# Patient Record
Sex: Male | Born: 1995 | Race: Black or African American | Hispanic: No | Marital: Single | Smoking: Former smoker
Health system: Southern US, Community
[De-identification: ages and names within clinical notes are randomized; demographics above are authoritative.]

## PROBLEM LIST (undated history)

## (undated) DIAGNOSIS — N4830 Priapism, unspecified: Secondary | ICD-10-CM

## (undated) DIAGNOSIS — I10 Essential (primary) hypertension: Secondary | ICD-10-CM

## (undated) DIAGNOSIS — J45909 Unspecified asthma, uncomplicated: Secondary | ICD-10-CM

---
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
## 2014-03-26 ENCOUNTER — Emergency Department: Payer: Self-pay | Admitting: Emergency Medicine

## 2014-03-26 LAB — COMPREHENSIVE METABOLIC PANEL
ALK PHOS: 71 U/L
ALT: 28 U/L
ANION GAP: 3 — AB (ref 7–16)
Albumin: 3.9 g/dL (ref 3.8–5.6)
BILIRUBIN TOTAL: 0.5 mg/dL (ref 0.2–1.0)
BUN: 10 mg/dL (ref 7–18)
CHLORIDE: 104 mmol/L (ref 98–107)
Calcium, Total: 8.6 mg/dL — ABNORMAL LOW (ref 9.0–10.7)
Co2: 32 mmol/L (ref 21–32)
Creatinine: 0.89 mg/dL (ref 0.60–1.30)
EGFR (African American): >60
Glucose: 102 mg/dL — ABNORMAL HIGH (ref 65–99)
Osmolality: 277 (ref 275–301)
POTASSIUM: 4.3 mmol/L (ref 3.5–5.1)
SGOT(AST): 10 U/L (ref 10–41)
SODIUM: 139 mmol/L (ref 136–145)
TOTAL PROTEIN: 7.3 g/dL (ref 6.4–8.6)

## 2014-03-26 LAB — CBC
HCT: 49.5 % (ref 40.0–52.0)
HGB: 16.3 g/dL (ref 13.0–18.0)
MCH: 30.2 pg (ref 26.0–34.0)
MCHC: 33 g/dL (ref 32.0–36.0)
MCV: 91 fL (ref 80–100)
Platelet: 274 10*3/uL (ref 150–440)
RBC: 5.41 10*6/uL (ref 4.40–5.90)
RDW: 13.2 % (ref 11.5–14.5)
WBC: 10.3 10*3/uL (ref 3.8–10.6)

## 2014-03-30 ENCOUNTER — Emergency Department: Payer: Self-pay | Admitting: Emergency Medicine

## 2014-04-02 ENCOUNTER — Emergency Department: Payer: Self-pay | Admitting: Emergency Medicine

## 2014-04-02 LAB — URINALYSIS, COMPLETE
BLOOD: NEGATIVE
Bacteria: NONE SEEN
Bilirubin,UR: NEGATIVE
GLUCOSE, UR: NEGATIVE mg/dL (ref 0–75)
KETONE: NEGATIVE
NITRITE: NEGATIVE
PH: 5 (ref 4.5–8.0)
Protein: NEGATIVE
RBC,UR: 2 /HPF (ref 0–5)
SPECIFIC GRAVITY: 1.018 (ref 1.003–1.030)
Squamous Epithelial: 4
WBC UR: 12 /HPF (ref 0–5)

## 2014-04-02 LAB — DIFFERENTIAL
COMMENT - H1-COM1: NORMAL
Comment - H1-Com2: NORMAL
EOS PCT: 3 %
Lymphocytes: 19 %
MONOS PCT: 3 %
Segmented Neutrophils: 73 %
VARIANT LYMPHOCYTE - H1-RLYMPH: 2 %

## 2014-04-02 LAB — COMPREHENSIVE METABOLIC PANEL
ALBUMIN: 4 g/dL (ref 3.8–5.6)
Alkaline Phosphatase: 73 U/L
Anion Gap: 5 — ABNORMAL LOW (ref 7–16)
BUN: 12 mg/dL (ref 7–18)
Bilirubin,Total: 0.8 mg/dL (ref 0.2–1.0)
CO2: 28 mmol/L (ref 21–32)
Calcium, Total: 8.4 mg/dL — ABNORMAL LOW (ref 9.0–10.7)
Chloride: 104 mmol/L (ref 98–107)
Creatinine: 0.89 mg/dL (ref 0.60–1.30)
EGFR (African American): >60
EGFR (Non-African Amer.): >60
Glucose: 98 mg/dL (ref 65–99)
Osmolality: 274 (ref 275–301)
Potassium: 4.1 mmol/L (ref 3.5–5.1)
SGOT(AST): 19 U/L (ref 10–41)
SGPT (ALT): 52 U/L
SODIUM: 137 mmol/L (ref 136–145)
Total Protein: 7.4 g/dL (ref 6.4–8.6)

## 2014-04-02 LAB — DRUG SCREEN, URINE
AMPHETAMINES, UR SCREEN: NEGATIVE (ref ?–1000)
BENZODIAZEPINE, UR SCRN: NEGATIVE (ref ?–200)
Barbiturates, Ur Screen: NEGATIVE (ref ?–200)
Cannabinoid 50 Ng, Ur ~~LOC~~: POSITIVE (ref ?–50)
Cocaine Metabolite,Ur ~~LOC~~: NEGATIVE (ref ?–300)
MDMA (ECSTASY) UR SCREEN: NEGATIVE (ref ?–500)
Methadone, Ur Screen: NEGATIVE (ref ?–300)
OPIATE, UR SCREEN: POSITIVE (ref ?–300)
PHENCYCLIDINE (PCP) UR S: NEGATIVE (ref ?–25)
Tricyclic, Ur Screen: NEGATIVE (ref ?–1000)

## 2014-04-02 LAB — CBC
HCT: 50.6 % (ref 40.0–52.0)
HGB: 17.1 g/dL (ref 13.0–18.0)
MCH: 30.6 pg (ref 26.0–34.0)
MCHC: 33.9 g/dL (ref 32.0–36.0)
MCV: 91 fL (ref 80–100)
Platelet: 274 10*3/uL (ref 150–440)
RBC: 5.59 10*6/uL (ref 4.40–5.90)
RDW: 13 % (ref 11.5–14.5)
WBC: 8.3 10*3/uL (ref 3.8–10.6)

## 2014-04-04 ENCOUNTER — Emergency Department: Payer: Self-pay | Admitting: Emergency Medicine

## 2014-04-24 ENCOUNTER — Emergency Department: Payer: Self-pay | Admitting: Emergency Medicine

## 2014-05-14 ENCOUNTER — Emergency Department: Payer: Self-pay | Admitting: Emergency Medicine

## 2014-05-24 ENCOUNTER — Emergency Department: Payer: Self-pay | Admitting: Emergency Medicine

## 2014-05-30 ENCOUNTER — Emergency Department: Payer: Self-pay | Admitting: Emergency Medicine

## 2014-09-22 NOTE — Consult Note (Signed)
PATIENT NAME:  Justin [name], Justin [name] MR#:  [id number] DATE OF BIRTH:  10-04-1995  DATE OF CONSULTATION:  04/02/2014  REFERRING PHYSICIAN:   CONSULTING PHYSICIAN:  Justin [name]. Justin ShellHart, DO  HISTORY OF PRESENT ILLNESS: This is a 19 year old with a second episode of priapism in the last 2 or 3 weeks. He has been in the ER at least once, twice or possibly 3 times. The only risk factors that we have diagnosed at this point are marijuana use. Denies cocaine use. He was given some Dilaudid and Sudafed and did not initially come down. The Sudafed with phenylephrine was p.o. Plan is to do a drug screen and blood smear for sickle cell disease. He will be discharged on Sudafed phenylephrine p.r.n. for priapism.   The patient historically has asthma.   Smokes quite a bit of marijuana. Denies cocaine use, drug use. He is otherwise healthy.   Historically, the patient has some burning on urination, penile pain and swelling. He has some abdominal pain that is suprapubic in nature.   PAST MEDICAL HISTORY: Priapism and asthma.   PAST SURGICAL HISTORY: None.  ALLERGIES: No known allergies.  REVIEW OF SYSTEMS: CONSTITUTIONAL: Recent illnesses - none. GASTROINTESTINAL: No nausea and vomiting,  MUSCULOSKELETAL: No joint pain. LYMPHATIC: No lymph node or ankle swelling.  CARDIOVASCULAR: No chest pain.  RESPIRATORY: He denies shortness of breath.  PHYSICAL EXAMINATION: GENERAL: Well-developed, well-nourished, overweight African American male in acute distress with an erect penis.  ENT: Pharynx normal.  NECK: Normal. RESPIRATORY: No shortness of breath. Breath sounds are normal.  HEART: Sounds with regular rate and rhythm. No tachycardia or bradycardia, but he does have increased blood pressure. EXTREMITIES: Nontender. No pedal edema.  NEUROPSYCHIATRIC: Oriented x4. Depressed mood and affect and anxious. VITAL SIGNS: Blood pressure 180/121.  SKIN: No rashes. Dry and intact.  GENITOURINARY: His testes appear to be  smaller than normal. His priapism is quite erect. He is circumcised. Glans is normal in size. The penis is normal in shape, consistency and size.   ASSESSMENT AND RECOMMENDATIONS: He spontaneously lost his erection so we are going to send him home on his medication. I am awaiting the smear.  Followup as p.r.n. for his problem. He will be sent home on his Sudafed phenylephrine for recurrent. At this point, we do not desire to use flutamide or Cialis 5 mg as a preventive measure for his erections. ____________________________ Justin [name]. Justin ShellHart, DO rdh:sb [name]: 04/02/2014 08:49:00 ET T: 04/02/2014 13:54:22 ET JOB#: [id number]  cc: Justin [name]. Justin ShellHart, DO, <Dictator> Justin [name] Justin Grenda DO ELECTRONICALLY SIGNED 04/16/2014 16:34

## 2014-09-26 NOTE — Consult Note (Signed)
PATIENT NAME:  Justin [name], Justin [name] MR#:  [id number] DATE OF BIRTH:  Feb 16, 1996  DATE OF CONSULTATION:  05/30/2014  REFERRING PHYSICIAN:  ED  CONSULTING PHYSICIAN:  Claris Gladden, MD  REASON FOR CONSULTATION: Priapism, requesting service via Emergency Room.   HISTORY OF PRESENT ILLNESS: This is a 19 year old male with recurrent episodes of priapism who presents today with painful erection since 6:00 a.m. to the Emergency Room. He has been seen for the same problem 8 times since October of 2015 in the Emergency Room and never has previously required injection therapy due to the priapism resolving spontaneously on its own.  He does admit to using marijuana the day of or the day previous to his priapistic event. He denies family or personal history of sickle cell anemia, testing as been requested in the past but no evidece that this was actually performed.    Today he had a priapism since 6 a.m. which has become progressively more painful throughout the day. He did try to take a Sudafed at home without resolution of his erection. He presented to the Emergency Room at 11:30 today at which time he was found to be profoundly hypertensive to 215/127. Urology was called at approximately 2:30 p.m. to evaluate the patient.   PAST MEDICAL HISTORY: Significant for asthma.   PAST SURGICAL HISTORY: Negative.  FAMILY HISTORY: No family history of sickle cell anemia or thalassemia.  SOCIAL HISTORY: The patient does endorse using frequent marijuana but denies any other illicit drugs, including cocaine, heroine or any other injectables or pills. He does drink alcohol socially. He also smokes cigarettes. He has a girlfriend who presents with him today.   ALLERGIES: None.   MEDICATIONS: ProAir p.r.n. shortness of breath q. 4 hours.  REVIEW OF SYSTEMS:  CONSTITUTIONAL: No weight loss, fevers or chills.  GASTROINTESTINAL: No nausea, vomiting.  LYMPHATIC: No enlarged lymph nodes or lower extremity edema.   CARDIOVASCULAR: No chest pain or palpitations.  RESPIRATORY: No shortness of breath or wheezing. MUSCULOSKELETAL: No myalgias or joint pain. GENITOURINARY: No voiding symptoms. No dysuria. No hematuria. No testicular pain.  ENDOCRINE: No night sweats or skin changes. No polyuria. NEUROLOGICAL: No CVA, headache or weakness.   HEENT: No dry eyes. No sore throat. No difficulty swallowing.  All other systems reviewed and are otherwise negative other than as per HPI.   PHYSICAL EXAMINATION:  VITAL SIGNS: Blood pressure at the time of examination 182/124, pulse 90, respirations 20.  GENERAL: No acute distress, alert and oriented, ambulating around the room at the time of arrival in no significant discomfort.  HEENT: Normal dentition. Normocephalic, atraumatic. Moist mucous membranes.  NEUROLOGICAL: Cranial nerves grossly intact. No focal deficits.  RESPIRATORY: No increased work of breathing or respiratory distress.  CARDIOVASCULAR: No lower extremity edema.  ABDOMEN: Soft, nontender, nondistended. No rebound or guarding. No CVA tenderness bilaterally. Bladder is nonpalpable.  GENITOURINARY: Erect circumcised phallus. Scrotum unremarkable, nontender. Testicles normal without masses.  RECTAL: Deferred today, is not pertinent. SKIN: No rashes or bruising.  MUSCULOSKELETAL: No abnormalities.  LYMPHATICS: No enlarged inguinal or axillary lymph nodes.   LABORATORY DATA: No laboratory data for review today.   ASSESSMENT AND PLAN: This is a 19 year old male with recurrent episodes of priapism, which generally has resolved spontaneously without need for intracavernosal phenylephrine or shunting. Per review of laboratory data, although the patient states he has no history of sickle cell anemia I do not see any laboratory data in our system to confirm that hemoglobin electrophoresis has been performed.  I also do not see a toxicology screen to rule out other substances other than marijuana, which the  patient does endorse. I suspect there is an underlying etiology for his recurrent episodes, either drug related or an underlying hemoglobinopathy. He has been previously scheduled to follow up in our clinic but has not shown up for these appointments. As I  was preparing phenylephrine injection, his erection resolved again spontaneously today greater than 50% without need for further intervention. I counseled him extensively on abstaining from any substances including both marijuana and any other illicit drugs. I urged him to follow up in clinic as scheduled for further workup, which he has agreed to. Thank you for allowing me to participate in the care of this patient again today.     ____________________________ Claris GladdenAshley J. Asbury Hair, MD ajb:TT D: 05/30/2014 19:25:52 ET T: 05/30/2014 20:04:25 ET JOB#: [id number]  cc: Claris GladdenAshley J. Tiffony Kite, MD, <Dictator> Claris GladdenASHLEY J Tige Meas MD ELECTRONICALLY SIGNED 06/13/2014 15:52

## 2014-12-08 ENCOUNTER — Emergency Department
Admission: EM | Admit: 2014-12-08 | Discharge: 2014-12-08 | Disposition: A | Discharge status: Home / Self Care | Payer: Medicaid Other | Attending: Emergency Medicine | Admitting: Emergency Medicine

## 2014-12-08 ENCOUNTER — Encounter: Payer: Self-pay | Admitting: Emergency Medicine

## 2014-12-08 DIAGNOSIS — Z760 Encounter for issue of repeat prescription: Secondary | ICD-10-CM | POA: Diagnosis present

## 2014-12-08 DIAGNOSIS — J4521 Mild intermittent asthma with (acute) exacerbation: Secondary | ICD-10-CM | POA: Diagnosis not present

## 2014-12-08 DIAGNOSIS — Z79899 Other long term (current) drug therapy: Secondary | ICD-10-CM | POA: Diagnosis not present

## 2014-12-08 HISTORY — DX: Unspecified asthma, uncomplicated: J45.909

## 2014-12-08 MED ORDER — IPRATROPIUM-ALBUTEROL 0.5-2.5 (3) MG/3ML IN SOLN
3.0000 mL | Freq: Once | RESPIRATORY_TRACT | Status: AC
  Administered 2014-12-08: 3 mL via RESPIRATORY_TRACT

## 2014-12-08 MED ORDER — ALBUTEROL SULFATE HFA 108 (90 BASE) MCG/ACT IN AERS: 2.0000 | INHALATION_SPRAY | Freq: Four times a day (QID) | RESPIRATORY_TRACT | Status: DC | PRN

## 2014-12-08 MED ORDER — IPRATROPIUM-ALBUTEROL 0.5-2.5 (3) MG/3ML IN SOLN
RESPIRATORY_TRACT | Status: AC
  Administered 2014-12-08: 3 mL via RESPIRATORY_TRACT
  Filled 2014-12-08: qty 3

## 2014-12-08 NOTE — Discharge Instructions (Signed)

## 2014-12-08 NOTE — ED Notes (Signed)
Pt. Declined IV insertion.

## 2014-12-08 NOTE — ED Notes (Signed)
Pt. States he lost rescue inhaler 2 days ago.

## 2014-12-09 NOTE — ED Provider Notes (Signed)
[facility] Emergency Department Provider Note ____________________________________________  Time seen: 2055  I have reviewed the triage vital signs and the nursing notes.  HISTORY  Chief Complaint  Asthma  HPI Justin [name] is a 19 y.o. male reports to the ED for a refill of his pro-air inhaler. He describes that he ran out of his rescue inhaler about 2 days ago. He reports onset today of wheezing upon arrival to the ED. He reports his symptoms have since resolved after receiving a DuoNeb treatment in triage. He denies any previous complaints of fever, chills, sweats, nausea, vomiting, or chest pain. Denies any pain at triage with a 0 out of 10 pain score.  Past Medical History  Diagnosis Date  . Asthma    There are no active problems to display for this patient.  History reviewed. No pertinent past surgical history.  Current Outpatient Rx  Name  Route  Sig  Dispense  Refill  . albuterol (PROVENTIL HFA;VENTOLIN HFA) 108 (90 BASE) MCG/ACT inhaler   Inhalation   Inhale 2 puffs into the lungs every 6 (six) hours as needed for wheezing or shortness of breath.         Marland Kitchen. albuterol (PROVENTIL HFA;VENTOLIN HFA) 108 (90 BASE) MCG/ACT inhaler   Inhalation   Inhale 2 puffs into the lungs every 6 (six) hours as needed for wheezing or shortness of breath.   1 Inhaler   0    Allergies Review of patient's allergies indicates not on file.  History reviewed. No pertinent family history.  Social History History  Substance Use Topics  . Smoking status: Current Every Day Smoker -- 0.50 packs/day  . Smokeless tobacco: Not on file  . Alcohol Use: No   Review of Systems  Constitutional: Negative for fever. Eyes: Negative for visual changes. ENT: Negative for sore throat. Cardiovascular: Negative for chest pain. Respiratory: Negative for shortness of breath. Reports wheezing today.  Gastrointestinal: Negative for abdominal pain, vomiting and  diarrhea. Genitourinary: Negative for dysuria. Musculoskeletal: Negative for back pain. Skin: Negative for rash. Neurological: Negative for headaches, focal weakness or numbness. ____________________________________________  PHYSICAL EXAM:  VITAL SIGNS: ED Triage Vitals  Enc Vitals Group     BP 12/08/14 1922 155/108 mmHg     Pulse Rate 12/08/14 1922 87     Resp 12/08/14 1922 25     Temp 12/08/14 1922 98.6 F (37 C)     Temp Source 12/08/14 1922 Oral     SpO2 12/08/14 1922 96 %     Weight 12/08/14 1922 250 lb (113.399 kg)     Height 12/08/14 1922 6\' 1"  (1.854 m)     Head Cir --      Peak Flow --      Pain Score 12/08/14 1924 0     Pain Loc --      Pain Edu? --      Excl. in GC? --    Constitutional: Alert and oriented. Well appearing and in no distress. Eyes: Conjunctivae are normal. PERRL. Normal extraocular movements. ENT   Head: Normocephalic and atraumatic.   Nose: No congestion/rhinnorhea.   Mouth/Throat: Mucous membranes are moist.   Neck: Supple. No thyromegaly. Hematological/Lymphatic/Immunilogical: No cervical lymphadenopathy. Cardiovascular: Normal rate, regular rhythm.  Respiratory: Normal respiratory effort. No wheezes/rales/rhonchi. Musculoskeletal: Nontender with normal range of motion in all extremities.  Neurologic:  Normal gait without ataxia. Normal speech and language. No gross focal neurologic deficits are appreciated. Skin:  Skin is warm, dry and intact. No  rash noted. Psychiatric: Mood and affect are normal. Patient exhibits appropriate insight and judgment. ____________________________________________  PROCEDURES  DuoNeb x 1 in triage. ____________________________________________  INITIAL IMPRESSION / ASSESSMENT AND PLAN / ED COURSE  ED visit for medication refill.  Treatment for acute wheezing with resolution of symptoms. Albuterol prescription. Patient referred to [facility] for maintenance medications.   ____________________________________________  FINAL CLINICAL IMPRESSION(S) / ED DIAGNOSES  Final diagnoses:  Asthma, mild intermittent, with acute exacerbation     Lissa Hoard, PA-C 12/09/14 0103  Loleta Rose, MD 12/09/14 1529

## 2014-12-28 ENCOUNTER — Emergency Department
Admission: EM | Admit: 2014-12-28 | Discharge: 2014-12-28 | Disposition: A | Discharge status: Home / Self Care | Payer: Medicaid Other | Attending: Emergency Medicine | Admitting: Emergency Medicine

## 2014-12-28 ENCOUNTER — Encounter: Payer: Self-pay | Admitting: Emergency Medicine

## 2014-12-28 DIAGNOSIS — J4531 Mild persistent asthma with (acute) exacerbation: Secondary | ICD-10-CM | POA: Insufficient documentation

## 2014-12-28 DIAGNOSIS — Z720 Tobacco use: Secondary | ICD-10-CM | POA: Insufficient documentation

## 2014-12-28 DIAGNOSIS — Z760 Encounter for issue of repeat prescription: Secondary | ICD-10-CM

## 2014-12-28 DIAGNOSIS — Z79899 Other long term (current) drug therapy: Secondary | ICD-10-CM | POA: Insufficient documentation

## 2014-12-28 DIAGNOSIS — Z7951 Long term (current) use of inhaled steroids: Secondary | ICD-10-CM | POA: Diagnosis not present

## 2014-12-28 DIAGNOSIS — J4530 Mild persistent asthma, uncomplicated: Secondary | ICD-10-CM

## 2014-12-28 MED ORDER — ALBUTEROL SULFATE HFA 108 (90 BASE) MCG/ACT IN AERS: 2.0000 | INHALATION_SPRAY | Freq: Four times a day (QID) | RESPIRATORY_TRACT | Status: DC | PRN

## 2014-12-28 MED ORDER — CETIRIZINE HCL 5 MG PO TABS: 5.0000 mg | ORAL_TABLET | Freq: Every day | ORAL | Status: DC

## 2014-12-28 MED ORDER — TRIAMCINOLONE ACETONIDE 55 MCG/ACT NA AERO: 1.0000 | INHALATION_SPRAY | Freq: Two times a day (BID) | NASAL | Status: DC

## 2014-12-28 NOTE — ED Provider Notes (Signed)
[facility] Emergency Department Provider Note? ____________________________________________ ? Time seen: 0815 ? I have reviewed the triage vital signs and the nursing notes. ________ HISTORY ? Chief Complaint Medication Refill  HPI  ARDIS Justin [name] is a 19 y.o. male reports to the ED with request for a pro-air MDI refill. He describes everyday use of his albuterol inhaler, for chest tightness and wheezing. He also reports that he is poorly compliant with the previously prescribed steroid nasal spray. He reports symptoms have been slightly aggravated by the increased heat and humidity, and notes some mild wheezing currently. He declines an offer for an nebulized albuterol treatment at this time.  Review of Systems  Constitutional: Negative for fever. Eyes: Negative for visual changes. ENT: Negative for sore throat. Cardiovascular: Negative for chest pain. Respiratory: Negative for shortness of breath. Reports some wheezing Gastrointestinal: Negative for abdominal pain, vomiting and diarrhea. Musculoskeletal: Negative for back pain. Skin: Negative for rash. Neurological: Negative for headaches, focal weakness or numbness.  10-point ROS otherwise negative. ____________________________________________  Past Medical History  Diagnosis Date  . Asthma    There are no active problems to display for this patient. ? No past surgical history on file. ? Current Outpatient Rx  Name  Route  Sig  Dispense  Refill  . albuterol (PROVENTIL HFA;VENTOLIN HFA) 108 (90 BASE) MCG/ACT inhaler   Inhalation   Inhale 2 puffs into the lungs every 6 (six) hours as needed for wheezing or shortness of breath.         Marland Kitchen albuterol (PROVENTIL HFA;VENTOLIN HFA) 108 (90 BASE) MCG/ACT inhaler   Inhalation   Inhale 2 puffs into the lungs every 6 (six) hours as needed for wheezing or shortness of breath.   1 Inhaler   0   . albuterol (PROVENTIL HFA;VENTOLIN HFA) 108 (90 BASE)  MCG/ACT inhaler   Inhalation   Inhale 2 puffs into the lungs every 6 (six) hours as needed for wheezing or shortness of breath.   1 Inhaler   0   . cetirizine (ZYRTEC) 5 MG tablet   Oral   Take 1 tablet (5 mg total) by mouth daily.   30 tablet   0   . triamcinolone (NASACORT AQ) 55 MCG/ACT AERO nasal inhaler   Nasal   Place 1 spray into the nose 2 (two) times daily.   1 Inhaler   1   ? Allergies Review of patient's allergies indicates no known allergies. ? No family history on file. ? Social History History  Substance Use Topics  . Smoking status: Current Every Day Smoker -- 0.50 packs/day  . Smokeless tobacco: Not on file  . Alcohol Use: No    PHYSICAL EXAM:  VITAL SIGNS: ED Triage Vitals  Enc Vitals Group     BP 12/28/14 0806 134/88 mmHg     Pulse Rate 12/28/14 0806 85     Resp --      Temp 12/28/14 0806 98.2 F (36.8 C)     Temp Source 12/28/14 0806 Oral     SpO2 12/28/14 0806 97 %     Weight 12/28/14 0806 250 lb (113.399 kg)     Height 12/28/14 0806  (1.854 m)     Head Cir --      Peak Flow --      Pain Score 12/28/14 0801 0     Pain Loc --      Pain Edu? --      Excl. in GC? --  Constitutional: Alert and oriented. Well appearing and in no distress. Eyes: Conjunctivae are normal. PERRL. Normal extraocular movements. ENT   Head: Normocephalic and atraumatic.   Nose: No congestion/rhinnorhea.   Mouth/Throat: Mucous membranes are moist.      Ears: Normal external exam. Canals clear. TMs clear bilaterally.   Neck: Supple. No lymphadenopathy. Cardiovascular: Normal rate, regular rhythm. Normal and symmetric distal pulses are present in all extremities. No murmurs, rubs, or gallops. Respiratory: Normal respiratory effort without tachypnea nor retractions. Mild expiratory wheezes bilaterally. No rales/rhonchi. Musculoskeletal: Normal range of motion in all extremities.  Neurologic:  Normal speech and language. No gross focal neurologic  deficits are appreciated.  Skin:  Skin is warm, dry and intact. No rash noted. Psychiatric: Mood and affect are normal. Patient exhibits appropriate insight and judgment. ______________________________________________________ INITIAL IMPRESSION / ASSESSMENT AND PLAN / ED COURSE ? Discussed the importance of seeking out a primary provider to better manage asthma. I added that daily rescue inhaler use indicates poor asthma control.  Patient needs to restart daily nasal spray and allergy medicine.  Will once again provide referrals to the local community clinics as stress that he not seek refills in the ED. ____________________________________________ FINAL CLINICAL IMPRESSION(S) / ED DIAGNOSES?  Final diagnoses:  Medication refill  Asthma, mild persistent, uncomplicated      Lissa Hoard, PA-C 12/28/14 9604  Darien Ramus, MD 12/28/14 1538

## 2014-12-28 NOTE — Discharge Instructions (Signed)
Asthma °Asthma is a condition of the lungs in which the airways tighten and narrow. Asthma can make it hard to breathe. Asthma cannot be cured, but medicine and lifestyle changes can help control it. Asthma may be started (triggered) by: °· Animal skin flakes (dander). °· Dust. °· Cockroaches. °· Pollen. °· Mold. °· Smoke. °· Cleaning products. °· Hair sprays or aerosol sprays. °· Paint fumes or strong smells. °· Cold air, weather changes, and winds. °· Crying or laughing hard. °· Stress. °· Certain medicines or drugs. °· Foods, such as dried fruit, potato chips, and sparkling grape juice. °· Infections or conditions (colds, flu). °· Exercise. °· Certain medical conditions or diseases. °· Exercise or tiring activities. °HOME CARE  °· Take medicine as told by your doctor. °· Use a peak flow meter as told by your doctor. A peak flow meter is a tool that measures how well the lungs are working. °· Record and keep track of the peak flow meter's readings. °· Understand and use the asthma action plan. An asthma action plan is a written plan for taking care of your asthma and treating your attacks. °· To help prevent asthma attacks: °¨ Do not smoke. Stay away from secondhand smoke. °¨ Change your heating and air conditioning filter often. °¨ Limit your use of fireplaces and wood stoves. °¨ Get rid of pests (such as roaches and mice) and their droppings. °¨ Throw away plants if you see mold on them. °¨ Clean your floors. Dust regularly. Use cleaning products that do not smell. °¨ Have someone vacuum when you are not home. Use a vacuum cleaner with a HEPA filter if possible. °¨ Replace carpet with wood, tile, or vinyl flooring. Carpet can trap animal skin flakes and dust. °¨ Use allergy-proof pillows, mattress covers, and box spring covers. °¨ Wash bed sheets and blankets every week in hot water and dry them in a dryer. °¨ Use blankets that are made of polyester or cotton. °¨ Clean bathrooms and kitchens with bleach. If  possible, have someone repaint the walls in these rooms with mold-resistant paint. Keep out of the rooms that are being cleaned and painted. °¨ Wash hands often. °GET HELP IF: °· You have make a whistling sound when breaking (wheeze), have shortness of breath, or have a cough even if taking medicine to prevent attacks. °· The colored mucus you cough up (sputum) is thicker than usual. °· The colored mucus you cough up changes from clear or white to yellow, green, gray, or bloody. °· You have problems from the medicine you are taking such as: °¨ A rash. °¨ Itching. °¨ Swelling. °¨ Trouble breathing. °· You need reliever medicines more than 2-3 times a week. °· Your peak flow measurement is still at 50-79% of your personal best after following the action plan for 1 hour. °· You have a fever. °GET HELP RIGHT AWAY IF:  °· You seem to be worse and are not responding to medicine during an asthma attack. °· You are short of breath even at rest. °· You get short of breath when doing very little activity. °· You have trouble eating, drinking, or talking. °· You have chest pain. °· You have a fast heartbeat. °· Your lips or fingernails start to turn blue. °· You are light-headed, dizzy, or faint. °· Your peak flow is less than 50% of your personal best. °MAKE SURE YOU:  °· Understand these instructions. °· Will watch your condition. °· Will get help right away if you   are not doing well or get worse. Document Released: 11/04/2007 Document Revised: 10/02/2013 Document Reviewed: 12/15/2012 [facility] Patient Information 2015 [place], [place]. This information is not intended to replace advice given to you by your health care provider. Make sure you discuss any questions you have with your health care provider.  Medication Refill, Emergency Department We have refilled your medication today as a courtesy to you. It is best for your medical care, however, to take care of getting refills done through your primary caregiver's  office. They have your records and can do a better job of follow-up than we can in the emergency department. On maintenance medications, we often only prescribe enough medications to get you by until you are able to see your regular caregiver. This is a more expensive way to refill medications. In the future, please plan for refills so that you will not have to use the emergency department for this. Thank you for your help. Your help allows [place] to better take care of the daily emergencies that enter our department. Document Released: 09/04/2003 Document Revised: 08/10/2011 Document Reviewed: 08/25/2013 [facility] Patient Information 2015 [place], [place]. This information is not intended to replace advice given to you by your health care provider. Make sure you discuss any questions you have with your health care provider.  You should select a see a medical provider at one of the community clinics listed.  You should not return to the ED for inhaler refills in the future. Your healthcare needs regular visits with a provider in a clinic setting.

## 2014-12-28 NOTE — ED Notes (Signed)
Has asthma and is out of proair.  Uses prn and has been wheezing

## 2015-01-26 ENCOUNTER — Encounter: Payer: Self-pay | Admitting: Medical Oncology

## 2015-01-26 ENCOUNTER — Emergency Department
Admission: EM | Admit: 2015-01-26 | Discharge: 2015-01-26 | Disposition: A | Discharge status: Home / Self Care | Payer: Medicaid Other | Attending: Emergency Medicine | Admitting: Emergency Medicine

## 2015-01-26 DIAGNOSIS — Z720 Tobacco use: Secondary | ICD-10-CM | POA: Diagnosis not present

## 2015-01-26 DIAGNOSIS — J4521 Mild intermittent asthma with (acute) exacerbation: Secondary | ICD-10-CM

## 2015-01-26 DIAGNOSIS — Z79899 Other long term (current) drug therapy: Secondary | ICD-10-CM | POA: Insufficient documentation

## 2015-01-26 MED ORDER — IPRATROPIUM-ALBUTEROL 0.5-2.5 (3) MG/3ML IN SOLN
3.0000 mL | Freq: Once | RESPIRATORY_TRACT | Status: AC
  Administered 2015-01-26: 3 mL via RESPIRATORY_TRACT

## 2015-01-26 MED ORDER — ALBUTEROL SULFATE HFA 108 (90 BASE) MCG/ACT IN AERS: 1.0000 | INHALATION_SPRAY | Freq: Four times a day (QID) | RESPIRATORY_TRACT | Status: DC | PRN

## 2015-01-26 MED ORDER — IPRATROPIUM-ALBUTEROL 0.5-2.5 (3) MG/3ML IN SOLN
RESPIRATORY_TRACT | Status: AC
  Administered 2015-01-26: 3 mL via RESPIRATORY_TRACT
  Filled 2015-01-26: qty 3

## 2015-01-26 NOTE — ED Provider Notes (Signed)
[facility] Emergency Department Provider Note  ____________________________________________  Time seen: Approximately 11:20 AM  I have reviewed the triage vital signs and the nursing notes.   HISTORY  Chief Complaint Asthma    HPI Justin [name] is a 19 y.o. male patient here today secondary to exacerbation of his asthma. Patient was seen 2 weeks ago and given Ventolin inhaler. Patient state he requests pro-air inhaler  because Ventolin inhaler does not work for him. Patient admits to not being compliant with Ventolin inhaler  because he does not feel like it is working. Since the last visit patient still had not established a primary care doctor for his asthma control.Patient has moderate audible wheezing but denies shortness of breath.   Past Medical History  Diagnosis Date  . Asthma     There are no active problems to display for this patient.   History reviewed. No pertinent past surgical history.  Current Outpatient Rx  Name  Route  Sig  Dispense  Refill  . albuterol (PROAIR HFA) 108 (90 BASE) MCG/ACT inhaler   Inhalation   Inhale 1 puff into the lungs every 6 (six) hours as needed for wheezing or shortness of breath.   18 g   0   . albuterol (PROVENTIL HFA;VENTOLIN HFA) 108 (90 BASE) MCG/ACT inhaler   Inhalation   Inhale 2 puffs into the lungs every 6 (six) hours as needed for wheezing or shortness of breath.         Marland Kitchen albuterol (PROVENTIL HFA;VENTOLIN HFA) 108 (90 BASE) MCG/ACT inhaler   Inhalation   Inhale 2 puffs into the lungs every 6 (six) hours as needed for wheezing or shortness of breath.   1 Inhaler   0   . albuterol (PROVENTIL HFA;VENTOLIN HFA) 108 (90 BASE) MCG/ACT inhaler   Inhalation   Inhale 2 puffs into the lungs every 6 (six) hours as needed for wheezing or shortness of breath.   1 Inhaler   0   . cetirizine (ZYRTEC) 5 MG tablet   Oral   Take 1 tablet (5 mg total) by mouth daily.   30 tablet   0   .  triamcinolone (NASACORT AQ) 55 MCG/ACT AERO nasal inhaler   Nasal   Place 1 spray into the nose 2 (two) times daily.   1 Inhaler   1     Allergies Review of patient's allergies indicates no known allergies.  No family history on file.  Social History Social History  Substance Use Topics  . Smoking status: Current Every Day Smoker -- 0.50 packs/day    Types: Cigarettes  . Smokeless tobacco: None  . Alcohol Use: No    Review of Systems Constitutional: No fever/chills Eyes: No visual changes. ENT: No sore throat. Cardiovascular: Denies chest pain. Respiratory: Denies shortness of breath. Wheezing Gastrointestinal: No abdominal pain.  No nausea, no vomiting.  No diarrhea.  No constipation. Genitourinary: Negative for dysuria. Musculoskeletal: Negative for back pain. Skin: Negative for rash. Neurological: Negative for headaches, focal weakness or numbness. 10-point ROS otherwise negative.  ____________________________________________   PHYSICAL EXAM:  VITAL SIGNS: ED Triage Vitals  Enc Vitals Group     BP 01/26/15 1051 131/99 mmHg     Pulse Rate 01/26/15 1051 64     Resp --      Temp 01/26/15 1051 97.7 F (36.5 C)     Temp Source 01/26/15 1051 Oral     SpO2 01/26/15 1051 98 %     Weight 01/26/15 1051  250 lb (113.399 kg)     Height 01/26/15 1051  (1.854 m)     Head Cir --      Peak Flow --      Pain Score 01/26/15 1057 0     Pain Loc --      Pain Edu? --      Excl. in GC? --     Constitutional: Alert and oriented. Well appearing and in no acute distress. Eyes: Conjunctivae are normal. PERRL. EOMI. Head: Atraumatic. Nose: No congestion/rhinnorhea. Mouth/Throat: Mucous membranes are moist.  Oropharynx non-erythematous. Neck: No stridor.  No cervical spine tenderness to palpation. Hematological/Lymphatic/Immunilogical: No cervical lymphadenopathy. Cardiovascular: Normal rate, regular rhythm. Grossly normal heart sounds.  Good peripheral  circulation. Respiratory: Normal respiratory effort.  No retractions. Lungs expiratory wheezing  Gastrointestinal: Soft and nontender. No distention. No abdominal bruits. No CVA tenderness. Musculoskeletal: No lower extremity tenderness nor edema.  No joint effusions. Neurologic:  Normal speech and language. No gross focal neurologic deficits are appreciated. No gait instability. Skin:  Skin is warm, dry and intact. No rash noted. Psychiatric: Mood and affect are normal. Speech and behavior are normal.  ____________________________________________   LABS (all labs ordered are listed, but only abnormal results are displayed)  Labs Reviewed - No data to display ____________________________________________  EKG   ____________________________________________  RADIOLOGY   ____________________________________________   PROCEDURES  Procedure(s) performed: None  Critical Care performed: No  ____________________________________________   INITIAL IMPRESSION / ASSESSMENT AND PLAN / ED COURSE  Pertinent labs & imaging results that were available during my care of the patient were reviewed by me and considered in my medical decision making (see chart for details).  Mild persistent uncomplicated asthma. Patient improved status post  nebulized treatmens. Patient given a prescription for pro-air. Patient advised to find a primary care doctor tomorrow to control his asthma. [facility] ____________________________________________   FINAL CLINICAL IMPRESSION(S) / ED DIAGNOSES  Final diagnoses:  Asthma exacerbation attacks, mild intermittent      Joni Reining, PA-C 01/26/15 1130  Minna Antis, MD 01/26/15 [phone]

## 2015-01-26 NOTE — ED Notes (Signed)
Pt reports that he was seen here 2 weeks ago and treated for his asthma, states that his inhaler that he was prescribed is not working. Pt reports he woke up this am with sob. Denies pain.

## 2015-01-26 NOTE — Discharge Instructions (Signed)

## 2015-03-08 ENCOUNTER — Emergency Department
Admission: EM | Admit: 2015-03-08 | Discharge: 2015-03-08 | Disposition: A | Discharge status: Home / Self Care | Payer: Medicaid Other | Attending: Emergency Medicine | Admitting: Emergency Medicine

## 2015-03-08 ENCOUNTER — Encounter: Payer: Self-pay | Admitting: *Deleted

## 2015-03-08 DIAGNOSIS — J45901 Unspecified asthma with (acute) exacerbation: Secondary | ICD-10-CM

## 2015-03-08 DIAGNOSIS — J45909 Unspecified asthma, uncomplicated: Secondary | ICD-10-CM | POA: Diagnosis present

## 2015-03-08 DIAGNOSIS — Z720 Tobacco use: Secondary | ICD-10-CM | POA: Insufficient documentation

## 2015-03-08 DIAGNOSIS — Z79899 Other long term (current) drug therapy: Secondary | ICD-10-CM | POA: Insufficient documentation

## 2015-03-08 MED ORDER — PREDNISONE 20 MG PO TABS
60.0000 mg | ORAL_TABLET | Freq: Once | ORAL | Status: AC
  Administered 2015-03-08: 60 mg via ORAL
  Filled 2015-03-08: qty 3

## 2015-03-08 MED ORDER — IPRATROPIUM-ALBUTEROL 0.5-2.5 (3) MG/3ML IN SOLN
3.0000 mL | Freq: Once | RESPIRATORY_TRACT | Status: AC
  Administered 2015-03-08: 3 mL via RESPIRATORY_TRACT
  Filled 2015-03-08: qty 3

## 2015-03-08 MED ORDER — PREDNISONE 10 MG PO TABS: 50.0000 mg | ORAL_TABLET | Freq: Every day | ORAL | Status: DC

## 2015-03-08 MED ORDER — ALBUTEROL SULFATE HFA 108 (90 BASE) MCG/ACT IN AERS: 2.0000 | INHALATION_SPRAY | Freq: Four times a day (QID) | RESPIRATORY_TRACT | Status: DC | PRN

## 2015-03-08 NOTE — Discharge Instructions (Signed)

## 2015-03-08 NOTE — ED Notes (Signed)
Pt reports increase in shortness of breath over past few days. Does not have inhaler at home. Pt speaking in full sentences, but can hear expiratory wheezing.

## 2015-03-08 NOTE — ED Notes (Signed)
Reports relief with neb treatment.  Minimal wheezing in upper lobes

## 2015-03-08 NOTE — ED Provider Notes (Signed)
[facility] Emergency Department Provider Note  ____________________________________________  Time seen: Approximately 4:36 PM  I have reviewed the triage vital signs and the nursing notes.   HISTORY  Chief Complaint Asthma   HPI Justin [name] is a 19 y.o. male who presents to the emergency department for treatment of  asthma exacerbation. Heis out off his albuterol inhaler. He states that when it started raining today, he began to have more difficulty breathing than usual. He denies fever or productive cough.   Past Medical History  Diagnosis Date  . Asthma     There are no active problems to display for this patient.   History reviewed. No pertinent past surgical history.  Current Outpatient Rx  Name  Route  Sig  Dispense  Refill  . albuterol (PROVENTIL HFA;VENTOLIN HFA) 108 (90 BASE) MCG/ACT inhaler   Inhalation   Inhale 2 puffs into the lungs every 6 (six) hours as needed for wheezing or shortness of breath.   1 Inhaler   2   . cetirizine (ZYRTEC) 5 MG tablet   Oral   Take 1 tablet (5 mg total) by mouth daily.   30 tablet   0   . predniSONE (DELTASONE) 10 MG tablet   Oral   Take 5 tablets (50 mg total) by mouth daily.   25 tablet   0   . triamcinolone (NASACORT AQ) 55 MCG/ACT AERO nasal inhaler   Nasal   Place 1 spray into the nose 2 (two) times daily.   1 Inhaler   1     Allergies Review of patient's allergies indicates no known allergies.  No family history on file.  Social History Social History  Substance Use Topics  . Smoking status: Current Every Day Smoker -- 0.50 packs/day    Types: Cigarettes  . Smokeless tobacco: None  . Alcohol Use: No    Review of Systems Constitutional: No fever/chills Eyes: No visual changes. ENT: No sore throat. Cardiovascular: Denies chest pain. Respiratory: Occasional shortness of breath. Gastrointestinal: No abdominal pain.  No nausea, no vomiting.  No diarrhea.  No  constipation. Genitourinary: Negative for dysuria. Musculoskeletal: Negative for back pain. Skin: Negative for rash. Neurological: Negative for headaches, focal weakness or numbness.  10-point ROS otherwise negative.  ____________________________________________   PHYSICAL EXAM:  VITAL SIGNS: ED Triage Vitals  Enc Vitals Group     BP 03/08/15 1618 111/90 mmHg     Pulse Rate 03/08/15 1618 61     Resp 03/08/15 1618 20     Temp 03/08/15 1618 97.8 F (36.6 C)     Temp Source 03/08/15 1618 Oral     SpO2 03/08/15 1618 98 %     Weight 03/08/15 1618 250 lb (113.399 kg)     Height 03/08/15 1618  (1.854 m)     Head Cir --      Peak Flow --      Pain Score 03/08/15 1630 0     Pain Loc --      Pain Edu? --      Excl. in GC? --     Constitutional: Alert and oriented. Well appearing and in no acute distress. Eyes: Conjunctivae are normal. PERRL. EOMI. Head: Atraumatic. Nose: No congestion/rhinnorhea. Mouth/Throat: Mucous membranes are moist.  Oropharynx non-erythematous. Neck: No stridor.   Cardiovascular: Normal rate, regular rhythm. Grossly normal heart sounds.  Good peripheral circulation. Respiratory: Normal respiratory effort.  No retractions. Expiratory wheezing noted bilateral bases. Gastrointestinal: Soft and nontender. No  distention. No abdominal bruits. No CVA tenderness. Musculoskeletal: No lower extremity tenderness nor edema.  No joint effusions. Neurologic:  Normal speech and language. No gross focal neurologic deficits are appreciated. No gait instability. Skin:  Skin is warm, dry and intact. No rash noted. Psychiatric: Mood and affect are normal. Speech and behavior are normal.  ____________________________________________   LABS (all labs ordered are listed, but only abnormal results are displayed)  Labs Reviewed - No data to  display ____________________________________________  EKG   ____________________________________________  RADIOLOGY   ____________________________________________   PROCEDURES  Procedure(s) performed: None  Critical Care performed: No  ____________________________________________   INITIAL IMPRESSION / ASSESSMENT AND PLAN / ED COURSE  Pertinent labs & imaging results that were available during my care of the patient were reviewed by me and considered in my medical decision making (see chart for details).  Patient had some relief with Duo Neb and prednisone in the ER. He requested discharge. He was given Rx for Ventolin and prednisone and advised to follow up with the PCP of his choice or return to the ER for symptoms that change or worsen. ____________________________________________   FINAL CLINICAL IMPRESSION(S) / ED DIAGNOSES  Final diagnoses:  Asthma exacerbation      Chinita Pester, FNP 03/08/15 2207  Minna Antis, MD 03/08/15 2318

## 2015-03-27 ENCOUNTER — Emergency Department
Admission: EM | Admit: 2015-03-27 | Discharge: 2015-03-27 | Disposition: A | Discharge status: Home / Self Care | Payer: Medicaid Other | Attending: Emergency Medicine | Admitting: Emergency Medicine

## 2015-03-27 ENCOUNTER — Encounter: Payer: Self-pay | Admitting: *Deleted

## 2015-03-27 DIAGNOSIS — J4541 Moderate persistent asthma with (acute) exacerbation: Secondary | ICD-10-CM | POA: Insufficient documentation

## 2015-03-27 DIAGNOSIS — Z79899 Other long term (current) drug therapy: Secondary | ICD-10-CM | POA: Diagnosis not present

## 2015-03-27 DIAGNOSIS — Z720 Tobacco use: Secondary | ICD-10-CM | POA: Diagnosis not present

## 2015-03-27 DIAGNOSIS — J45909 Unspecified asthma, uncomplicated: Secondary | ICD-10-CM | POA: Diagnosis present

## 2015-03-27 MED ORDER — ALBUTEROL SULFATE HFA 108 (90 BASE) MCG/ACT IN AERS: INHALATION_SPRAY | RESPIRATORY_TRACT | Status: DC

## 2015-03-27 MED ORDER — PREDNISONE 20 MG PO TABS
60.0000 mg | ORAL_TABLET | ORAL | Status: AC
  Administered 2015-03-27: 60 mg via ORAL
  Filled 2015-03-27: qty 3

## 2015-03-27 MED ORDER — IPRATROPIUM-ALBUTEROL 0.5-2.5 (3) MG/3ML IN SOLN
3.0000 mL | Freq: Once | RESPIRATORY_TRACT | Status: AC
  Administered 2015-03-27: 3 mL via RESPIRATORY_TRACT
  Filled 2015-03-27 (×2): qty 3

## 2015-03-27 MED ORDER — IPRATROPIUM-ALBUTEROL 0.5-2.5 (3) MG/3ML IN SOLN
RESPIRATORY_TRACT | Status: AC
  Administered 2015-03-27: 3 mL via RESPIRATORY_TRACT
  Filled 2015-03-27: qty 3

## 2015-03-27 MED ORDER — IPRATROPIUM-ALBUTEROL 0.5-2.5 (3) MG/3ML IN SOLN
RESPIRATORY_TRACT | Status: AC
  Filled 2015-03-27: qty 3

## 2015-03-27 MED ORDER — IPRATROPIUM-ALBUTEROL 0.5-2.5 (3) MG/3ML IN SOLN
3.0000 mL | Freq: Once | RESPIRATORY_TRACT | Status: AC
  Administered 2015-03-27: 3 mL via RESPIRATORY_TRACT

## 2015-03-27 MED ORDER — PREDNISONE 20 MG PO TABS: 60.0000 mg | ORAL_TABLET | Freq: Every day | ORAL | Status: DC

## 2015-03-27 NOTE — ED Provider Notes (Signed)
[facility] Emergency Department Provider Note  ____________________________________________  Time seen: Approximately 5:02 PM  I have reviewed the triage vital signs and the nursing notes.   HISTORY  Chief Complaint Asthma    HPI Justin [name] is a 19 y.o. male with a history of asthma since childhood and who is a tobacco user presents with worsening shortness of breath for the last hour.  He had an albuterol inhaler over the last several weeks since his last emergency department visit but it has run out.  He has not yet been able to get back to [place] where his [place]CP [place] located.  He states that his wheezing and shortness of breath is severe and worsened by physical activity and with weather changes.  Rest makes it a little bit better but does not completely resolve it.  He denies fever/chills, chest pain, abdominal pain, nausea, vomiting, dysuria.   Past Medical History  Diagnosis Date  . Asthma     There are no active problems to display for this patient.   History reviewed. No pertinent past surgical history.  Current Outpatient Rx  Name  Route  Sig  Dispense  Refill  . albuterol (PROVENTIL HFA;VENTOLIN HFA) 108 (90 BASE) MCG/ACT inhaler      Inhale 4-6 puffs by mouth every 4 hours as needed for wheezing, cough, and/or shortness of breath   1 Inhaler   2   . cetirizine (ZYRTEC) 5 MG tablet   Oral   Take 1 tablet (5 mg total) by mouth daily.   30 tablet   0   . predniSONE (DELTASONE) 20 MG tablet   Oral   Take 3 tablets (60 mg total) by mouth daily.   15 tablet   0   . triamcinolone (NASACORT AQ) 55 MCG/ACT AERO nasal inhaler   Nasal   Place 1 spray into the nose 2 (two) times daily.   1 Inhaler   1     Allergies Review of patient's allergies indicates no known allergies.  No family history on file.  Social History Social History  Substance Use Topics  . Smoking status: Current Every Day Smoker -- 0.50 packs/day   Types: Cigarettes  . Smokeless tobacco: None  . Alcohol Use: No    Review of Systems Constitutional: No fever/chills Eyes: No visual changes. ENT: No sore throat. Cardiovascular: Denies chest pain. Respiratory: Shortness of breath with wheezing that feels similar to prior asthma exacerbations Gastrointestinal: No abdominal pain.  No nausea, no vomiting.  No diarrhea.  No constipation. Genitourinary: Negative for dysuria. Musculoskeletal: Negative for back pain. Skin: Negative for rash. Neurological: Negative for headaches, focal weakness or numbness.  10-point ROS otherwise negative.  ____________________________________________   PHYSICAL EXAM:  VITAL SIGNS: ED Triage Vitals  Enc Vitals Group     BP 03/27/15 1638 176/105 mmHg     Pulse Rate 03/27/15 1638 82     Resp 03/27/15 1638 18     Temp 03/27/15 1638 97.7 F (36.5 C)     Temp Source 03/27/15 1638 Oral     SpO2 03/27/15 1638 96 %     Weight 03/27/15 1638 260 lb (117.935 kg)     Height 03/27/15 1638  (1.854 m)     Head Cir --      Peak Flow --      Pain Score --      Pain Loc --      Pain Edu? --  Excl. in GC? --     Constitutional: Alert and oriented. Well appearing and in no acute distress. Eyes: Conjunctivae are normal. PERRL. EOMI. Head: Atraumatic. Nose: No congestion/rhinnorhea. Mouth/Throat: Mucous membranes are moist.  Oropharynx non-erythematous. Neck: No stridor.   Cardiovascular: Normal rate, regular rhythm. Grossly normal heart sounds.  Good peripheral circulation. Respiratory: Slightly increased respiratory effort.  Moderate to severe respiratory wheezing throughout all lung fields.  Wheezing is audible even without stethoscope auscultation Gastrointestinal: Soft and nontender. No distention. No abdominal bruits. No CVA tenderness. Musculoskeletal: No lower extremity tenderness nor edema.  No joint effusions. Neurologic:  Normal speech and language. No gross focal neurologic deficits are  appreciated.  Skin:  Skin is warm, dry and intact. No rash noted. Psychiatric: Mood and affect are normal. Speech and behavior are normal.  ____________________________________________   LABS (all labs ordered are listed, but only abnormal results are displayed)  Labs Reviewed - No data to display ____________________________________________  EKG  Not indicated ____________________________________________  RADIOLOGY   No results found.  ____________________________________________   PROCEDURES  Procedure(s) performed: None  Critical Care performed: No ____________________________________________   INITIAL IMPRESSION / ASSESSMENT AND PLAN / ED COURSE  Pertinent labs & imaging results that were available during my care of the patient were reviewed by me and considered in my medical decision making (see chart for details).  The patient's signs and symptoms are consistent with an asthma exacerbation.  I stressed to him the importance of not using tobacco and of following up as an outpatient so that he can get on a maintenance regimen of medications.  Here in the emergency department I will give him a dose of prednisone and prescribe him a course, and I will give him 3 DuoNeb labs and prescribe him another albuterol inhaler.  On 2 separate occasions I stressed the importance of close outpatient follow-up and I gave him my usual customary return precautions.  ____________________________________________  FINAL CLINICAL IMPRESSION(S) / ED DIAGNOSES  Final diagnoses:  Acute asthma exacerbation, moderate persistent      NEW MEDICATIONS STARTED DURING THIS VISIT:  New Prescriptions   ALBUTEROL (PROVENTIL HFA;VENTOLIN HFA) 108 (90 BASE) MCG/ACT INHALER    Inhale 4-6 puffs by mouth every 4 hours as needed for wheezing, cough, and/or shortness of breath   PREDNISONE (DELTASONE) 20 MG TABLET    Take 3 tablets (60 mg total) by mouth daily.     Loleta Roseory Aarian Griffie, MD 03/27/15  [phone]

## 2015-03-27 NOTE — Discharge Instructions (Signed)
We believe that your symptoms are caused today by an exacerbation of your asthma.  Please take the prescribed medications and any medications that you have at home.  Follow up with your doctor as recommended.  If you develop any new or worsening symptoms, including but not limited to fever, persistent vomiting, worsening shortness of breath, or other symptoms that concern you, please return to the Emergency Department immediately. ° ° °Asthma °Asthma is a recurring condition in which the airways tighten and narrow. Asthma can make it difficult to breathe. It can cause coughing, wheezing, and shortness of breath. Asthma episodes, also called asthma attacks, range from minor to life-threatening. Asthma cannot be cured, but medicines and lifestyle changes can help control it. °CAUSES °Asthma is believed to be caused by inherited (genetic) and environmental factors, but its exact cause is unknown. Asthma may be triggered by allergens, lung infections, or irritants in the air. Asthma triggers are different for each person. Common triggers include:  °1. Animal dander. °2. Dust mites. °3. Cockroaches. °4. Pollen from trees or grass. °5. Mold. °6. Smoke. °7. Air pollutants such as dust, household cleaners, hair sprays, aerosol sprays, paint fumes, strong chemicals, or strong odors. °8. Cold air, weather changes, and winds (which increase molds and pollens in the air). °9. Strong emotional expressions such as crying or laughing hard. °10. Stress. °11. Certain medicines (such as aspirin) or types of drugs (such as beta-blockers). °12. Sulfites in foods and drinks. Foods and drinks that may contain sulfites include dried fruit, potato chips, and sparkling grape juice. °13. Infections or inflammatory conditions such as the flu, a cold, or an inflammation of the nasal membranes (rhinitis). °14. Gastroesophageal reflux disease (GERD). °15. Exercise or strenuous activity. °SYMPTOMS °Symptoms may occur immediately after asthma is  triggered or many hours later. Symptoms include: °1. Wheezing. °2. Excessive nighttime or early morning coughing. °3. Frequent or severe coughing with a common cold. °4. Chest tightness. °5. Shortness of breath. °DIAGNOSIS  °The diagnosis of asthma is made by a review of your medical history and a physical exam. Tests may also be performed. These may include: °· Lung function studies. These tests show how much air you breathe in and out. °· Allergy tests. °· Imaging tests such as X-rays. °TREATMENT  °Asthma cannot be cured, but it can usually be controlled. Treatment involves identifying and avoiding your asthma triggers. It also involves medicines. There are 2 classes of medicine used for asthma treatment:  °· Controller medicines. These prevent asthma symptoms from occurring. They are usually taken every day. °· Reliever or rescue medicines. These quickly relieve asthma symptoms. They are used as needed and provide short-term relief. °Your health care provider will help you create an asthma action plan. An asthma action plan is a written plan for managing and treating your asthma attacks. It includes a list of your asthma triggers and how they may be avoided. It also includes information on when medicines should be taken and when their dosage should be changed. An action plan may also involve the use of a device called a peak flow meter. A peak flow meter measures how well the lungs are working. It helps you monitor your condition. °HOME CARE INSTRUCTIONS  °· Take medicines only as directed by your health care provider. Speak with your health care provider if you have questions about how or when to take the medicines. °· Use a peak flow meter as directed by your health care provider. Record and keep track of readings. °·   Understand and use the action plan to help minimize or stop an asthma attack without needing to seek medical care. °· Control your home environment in the following ways to help prevent asthma  attacks: °· Do not smoke. Avoid being exposed to secondhand smoke. °· Change your heating and air conditioning filter regularly. °· Limit your use of fireplaces and wood stoves. °· Get rid of pests (such as roaches and mice) and their droppings. °· Throw away plants if you see mold on them. °· Clean your floors and dust regularly. Use unscented cleaning products. °· Try to have someone else vacuum for you regularly. Stay out of rooms while they are being vacuumed and for a short while afterward. If you vacuum, use a dust mask from a hardware store, a double-layered or microfilter vacuum cleaner bag, or a vacuum cleaner with a HEPA filter. °· Replace carpet with wood, tile, or vinyl flooring. Carpet can trap dander and dust. °· Use allergy-proof pillows, mattress covers, and box spring covers. °· Wash bed sheets and blankets every week in hot water and dry them in a dryer. °· Use blankets that are made of polyester or cotton. °· Clean bathrooms and kitchens with bleach. If possible, have someone repaint the walls in these rooms with mold-resistant paint. Keep out of the rooms that are being cleaned and painted. °· Wash hands frequently. °SEEK MEDICAL CARE IF:  °· You have wheezing, shortness of breath, or a cough even if taking medicine to prevent attacks. °· The colored mucus you cough up (sputum) is thicker than usual. °· Your sputum changes from clear or white to yellow, green, gray, or bloody. °· You have any problems that may be related to the medicines you are taking (such as a rash, itching, swelling, or trouble breathing). °· You are using a reliever medicine more than 2-3 times per week. °· Your peak flow is still at 50-79% of your personal best after following your action plan for 1 hour. °· You have a fever. °SEEK IMMEDIATE MEDICAL CARE IF:  °· You seem to be getting worse and are unresponsive to treatment during an asthma attack. °· You are short of breath even at rest. °· You get short of breath when  doing very little physical activity. °· You have difficulty eating, drinking, or talking due to asthma symptoms. °· You develop chest pain. °· You develop a fast heartbeat. °· You have a bluish color to your lips or fingernails. °· You are light-headed, dizzy, or faint. °· Your peak flow is less than 50% of your personal best. °MAKE SURE YOU:  °· Understand these instructions. °· Will watch your condition. °· Will get help right away if you are not doing well or get worse. °Document Released: 05/18/2005 Document Revised: 10/02/2013 Document Reviewed: 12/15/2012 °[employer]® Patient Information ©2015 [employer]. This information is not intended to replace advice given to you by your health care provider. Make sure you discuss any questions you have with your health care provider. ° °How to Use a Nebulizer °If you have asthma or other breathing problems, you might need to breathe in (inhale) medicine. This can be done with a nebulizer. A nebulizer is a device that turns liquid medicine into a mist that you can inhale.  °There are different kinds of nebulizers. Most are small. With some, you breathe in through a mouthpiece. With others, a mask fits over your nose and mouth. Most nebulizers must be connected to a small air compressor. Air is forced   through tubing from the compressor to the nebulizer. The forced air changes the liquid into a fine spray. °RISKS AND COMPLICATIONS °The nebulizer must work properly for it to help your breathing. If the nebulizer does not produce mist, or if foam comes out, this indicates that the nebulizer is not working properly. Sometimes a filter can get clogged, or there might be a problem with the air compressor. Check the instruction booklet that came with your nebulizer. It should tell you how to fix problems or where to call for help. You should have at least one extra nebulizer at home. That way, you will always have one when you need it.  °HOW TO PREPARE BEFORE USING THE  NEBULIZER °Take these steps before using the nebulizer: °16. Check your medicine. Make sure it has not expired and is not damaged in any way.   °17. Wash your hands with soap and water.   °18. Put all the parts of your nebulizer on a sturdy, flat surface. Make sure the tubing connects the compressor and the nebulizer. °19. Measure the liquid medicine according to your health care provider's instructions. Pour it into the nebulizer. °20. Attach the mouthpiece or mask.   °21. Test the nebulizer by turning it on to make sure a spray is coming out. Then, turn it off.   °HOW TO USE THE NEBULIZER °6. Sit down and focus on staying relaxed.   °7. If your nebulizer has a mask, put it over your nose and mouth. If you use a mouthpiece, put it in your mouth. Press your lips firmly around the mouthpiece. °8. Turn on the nebulizer.   °9. Breathe out.   °10. Some nebulizers have a finger valve. If yours does, cover up the air hole so the air gets to the nebulizer. °11. Once the medicine begins to mist out, take slow, deep breaths. If there is a finger valve, release it at the end of your breath. °12. Continue taking slow, deep breaths until the nebulizer is empty.   °Be sure to stop the machine at any point if you start coughing or if the medicine foams or bubbles. °HOW TO CLEAN THE NEBULIZER  °The nebulizer and all its parts must be kept very clean. Follow the manufacturer's instructions for cleaning. For most nebulizers, you should follow these guidelines: °· Wash the nebulizer after each use. Use warm water and soap. Rinse it well. Shake the nebulizer to remove extra water. Put it on a clean towel until it is completely dry. To make sure it is dry, put the nebulizer back together. Turn on the compressor for a few minutes. This will blow air through the nebulizer.   °· Do not wash the tubing or the finger valve.   °· Store the nebulizer in a dust-free place.   °· Inspect the filter every week. Replace it any time it looks dirty.    °· Sometimes the nebulizer will need a more complete cleaning. The instruction booklet should say how often you need to do this. °SEEK MEDICAL CARE IF:  °· You continue to have difficulty breathing.   °· You have trouble using the nebulizer.   °Document Released: 05/06/2009 Document Revised: 10/02/2013 Document Reviewed: 11/07/2012 °Exit[employer]® Patient Information ©2015 [employer]. This information is not intended to replace advice given to you by your health care provider. Make sure you discuss any questions you have with your health care provider. ° °How to Use an Inhaler °Proper inhaler technique is very important. Good technique ensures that the medicine reaches the lungs. Poor technique results in depositing the medicine   on the tongue and back of the throat rather than in the airways. If you do not use the inhaler with good technique, the medicine will not help you. °STEPS TO FOLLOW IF USING AN INHALER WITHOUT AN EXTENSION TUBE °22. Remove the cap from the inhaler. °23. If you are using the inhaler for the first time, you will need to prime it. Shake the inhaler for 5 seconds and release four puffs into the air, away from your face. Ask your health care provider or pharmacist if you have questions about priming your inhaler. °24. Shake the inhaler for 5 seconds before each breath in (inhalation). °25. Position the inhaler so that the top of the canister faces up. °26. Put your index finger on the top of the medicine canister. Your thumb supports the bottom of the inhaler. °27. Open your mouth. °28. Either place the inhaler between your teeth and place your lips tightly around the mouthpiece, or hold the inhaler 1-2 inches away from your open mouth. If you are unsure of which technique to use, ask your health care provider. °29. Breathe out (exhale) normally and as completely as possible. °30. Press the canister down with your index finger to release the medicine. °31. At the same time as the canister is  pressed, inhale deeply and slowly until your lungs are completely filled. This should take 4-6 seconds. Keep your tongue down. °32. Hold the medicine in your lungs for 5-10 seconds (10 seconds is best). This helps the medicine get into the small airways of your lungs. °33. Breathe out slowly, through pursed lips. Whistling is an example of pursed lips. °34. Wait at least 15-30 seconds between puffs. Continue with the above steps until you have taken the number of puffs your health care provider has ordered. Do not use the inhaler more than your health care provider tells you. °35. Replace the cap on the inhaler. °36. Follow the directions from your health care provider or the inhaler insert for cleaning the inhaler. °STEPS TO FOLLOW IF USING AN INHALER WITH AN EXTENSION (SPACER) °13. Remove the cap from the inhaler. °14. If you are using the inhaler for the first time, you will need to prime it. Shake the inhaler for 5 seconds and release four puffs into the air, away from your face. Ask your health care provider or pharmacist if you have questions about priming your inhaler. °15. Shake the inhaler for 5 seconds before each breath in (inhalation). °16. Place the open end of the spacer onto the mouthpiece of the inhaler. °17. Position the inhaler so that the top of the canister faces up and the spacer mouthpiece faces you. °18. Put your index finger on the top of the medicine canister. Your thumb supports the bottom of the inhaler and the spacer. °19. Breathe out (exhale) normally and as completely as possible. °20. Immediately after exhaling, place the spacer between your teeth and into your mouth. Close your lips tightly around the spacer. °21. Press the canister down with your index finger to release the medicine. °22. At the same time as the canister is pressed, inhale deeply and slowly until your lungs are completely filled. This should take 4-6 seconds. Keep your tongue down and out of the way. °23. Hold the  medicine in your lungs for 5-10 seconds (10 seconds is best). This helps the medicine get into the small airways of your lungs. Exhale. °24. Repeat inhaling deeply through the spacer mouthpiece. Again hold that breath for up to 10   seconds (10 seconds is best). Exhale slowly. If it is difficult to take this second deep breath through the spacer, breathe normally several times through the spacer. Remove the spacer from your mouth. °25. Wait at least 15-30 seconds between puffs. Continue with the above steps until you have taken the number of puffs your health care provider has ordered. Do not use the inhaler more than your health care provider tells you. °26. Remove the spacer from the inhaler, and place the cap on the inhaler. °27. Follow the directions from your health care provider or the inhaler insert for cleaning the inhaler and spacer. °If you are using different kinds of inhalers, use your quick relief medicine to open the airways 10-15 minutes before using a steroid if instructed to do so by your health care provider. If you are unsure which inhalers to use and the order of using them, ask your health care provider, nurse, or respiratory therapist. °If you are using a steroid inhaler, always rinse your mouth with water after your last puff, then gargle and spit out the water. Do not swallow the water. °AVOID: °· Inhaling before or after starting the spray of medicine. It takes practice to coordinate your breathing with triggering the spray. °· Inhaling through the nose (rather than the mouth) when triggering the spray. °HOW TO DETERMINE IF YOUR INHALER IS FULL OR NEARLY EMPTY °You cannot know when an inhaler is empty by shaking it. A few inhalers are now being made with dose counters. Ask your health care provider for a prescription that has a dose counter if you feel you need that extra help. If your inhaler does not have a counter, ask your health care provider to help you determine the date you need to  refill your inhaler. Write the refill date on a calendar or your inhaler canister. Refill your inhaler 7-10 days before it runs out. Be sure to keep an adequate supply of medicine. This includes making sure it is not expired, and that you have a spare inhaler.  °SEEK MEDICAL CARE IF:  °· Your symptoms are only partially relieved with your inhaler. °· You are having trouble using your inhaler. °· You have some increase in phlegm. °SEEK IMMEDIATE MEDICAL CARE IF:  °· You feel little or no relief with your inhalers. You are still wheezing and are feeling shortness of breath or tightness in your chest or both. °· You have dizziness, headaches, or a fast heart rate. °· You have chills, fever, or night sweats. °· You have a noticeable increase in phlegm production, or there is blood in the phlegm. °MAKE SURE YOU:  °· Understand these instructions. °· Will watch your condition. °· Will get help right away if you are not doing well or get worse. °Document Released: 05/15/2000 Document Revised: 03/08/2013 Document Reviewed: 12/15/2012 °[employer]® Patient Information ©2015 [employer]. This information is not intended to replace advice given to you by your health care provider. Make sure you discuss any questions you have with your health care provider. ° ° ° °

## 2015-03-27 NOTE — ED Notes (Signed)
Pt here with c/o respiratory distress x 1 hour.  Doesn't have MDI to use.

## 2015-04-03 ENCOUNTER — Encounter: Payer: Self-pay | Admitting: Emergency Medicine

## 2015-04-03 ENCOUNTER — Emergency Department
Admission: EM | Admit: 2015-04-03 | Discharge: 2015-04-03 | Disposition: A | Discharge status: Home / Self Care | Payer: Medicaid Other | Attending: Emergency Medicine | Admitting: Emergency Medicine

## 2015-04-03 DIAGNOSIS — Z79899 Other long term (current) drug therapy: Secondary | ICD-10-CM | POA: Insufficient documentation

## 2015-04-03 DIAGNOSIS — F17210 Nicotine dependence, cigarettes, uncomplicated: Secondary | ICD-10-CM | POA: Diagnosis not present

## 2015-04-03 DIAGNOSIS — R1110 Vomiting, unspecified: Secondary | ICD-10-CM | POA: Insufficient documentation

## 2015-04-03 DIAGNOSIS — Z7952 Long term (current) use of systemic steroids: Secondary | ICD-10-CM | POA: Diagnosis not present

## 2015-04-03 MED ORDER — ONDANSETRON 4 MG PO TBDP: 4.0000 mg | ORAL_TABLET | Freq: Three times a day (TID) | ORAL | Status: DC | PRN

## 2015-04-03 NOTE — ED Notes (Signed)
Was at work and he vomited.  ;his work told him to come herer to get a note by 5pm.  Pt is not sick and denies fever.  Just needs note

## 2015-04-03 NOTE — ED Provider Notes (Signed)
[facility] Emergency Department Provider Note  ____________________________________________  Time seen: Approximately 12:23 PM  I have reviewed the triage vital signs and the nursing notes.   HISTORY  Chief Complaint Emesis   HPI Justin [name] is a 19 y.o. male is here with complaint of vomiting once. Patient states that he was at work and vomited. Patient states that his employer wants a note by 5:00 today. Patient denies feeling sick or having fever. Currently his blood pressure is elevated and he states that his mother has hypertension. He has never had problems with his blood pressure other than when he has "priapism".He denies any other family members feeling sick or having similar symptoms. He denies any pain at this time.   Past Medical History  Diagnosis Date  . Asthma     There are no active problems to display for this patient.   No past surgical history on file.  Current Outpatient Rx  Name  Route  Sig  Dispense  Refill  . albuterol (PROVENTIL HFA;VENTOLIN HFA) 108 (90 BASE) MCG/ACT inhaler      Inhale 4-6 puffs by mouth every 4 hours as needed for wheezing, cough, and/or shortness of breath   1 Inhaler   2   . cetirizine (ZYRTEC) 5 MG tablet   Oral   Take 1 tablet (5 mg total) by mouth daily.   30 tablet   0   . ondansetron (ZOFRAN ODT) 4 MG disintegrating tablet   Oral   Take 1 tablet (4 mg total) by mouth every 8 (eight) hours as needed for nausea or vomiting.   20 tablet   0   . predniSONE (DELTASONE) 20 MG tablet   Oral   Take 3 tablets (60 mg total) by mouth daily.   15 tablet   0   . triamcinolone (NASACORT AQ) 55 MCG/ACT AERO nasal inhaler   Nasal   Place 1 spray into the nose 2 (two) times daily.   1 Inhaler   1     Allergies Review of patient's allergies indicates no known allergies.  No family history on file.  Social History Social History  Substance Use Topics  . Smoking status: Current Every Day  Smoker -- 0.50 packs/day    Types: Cigarettes  . Smokeless tobacco: None  . Alcohol Use: No    Review of Systems Constitutional: No fever/chills Eyes: No visual changes. ENT: No sore throat. Cardiovascular: Denies chest pain. Respiratory: Denies shortness of breath. Gastrointestinal: No abdominal pain.  No nausea, positive vomiting.  No diarrhea.  No constipation. Genitourinary: Negative for dysuria. Musculoskeletal: Negative for back pain. Skin: Negative for rash. Neurological: Negative for headaches, focal weakness or numbness.  10-point ROS otherwise negative.  ____________________________________________   PHYSICAL EXAM:  VITAL SIGNS: ED Triage Vitals  Enc Vitals Group     BP 04/03/15 1125 149/104 mmHg     Pulse Rate 04/03/15 1125 97     Resp 04/03/15 1125 14     Temp 04/03/15 1125 98 F (36.7 C)     Temp Source 04/03/15 1125 Oral     SpO2 04/03/15 1125 99 %     Weight 04/03/15 1125 250 lb (113.399 kg)     Height 04/03/15 1125 6\' 1"  (1.854 m)     Head Cir --      Peak Flow --      Pain Score --      Pain Loc --      Pain Edu? --  Excl. in GC? --     Constitutional: Alert and oriented. Well appearing and in no acute distress. Eyes: Conjunctivae are normal. PERRL. EOMI. Head: Atraumatic. Nose: No congestion/rhinnorhea. Mouth/Throat: Mucous membranes are moist.  Oropharynx non-erythematous. Neck: No stridor.  Hematological/Lymphatic/Immunilogical: No cervical lymphadenopathy. Cardiovascular: Normal rate, regular rhythm. Grossly normal heart sounds.  Good peripheral circulation. Respiratory: Normal respiratory effort.  No retractions. Lungs CTAB. Gastrointestinal: Soft and nontender. No distention. Bowel sounds 4 quadrants within normal limits. No hyperactivity was heard. Musculoskeletal: No lower extremity tenderness nor edema.  No joint effusions. Neurologic:  Normal speech and language. No gross focal neurologic deficits are appreciated. No gait  instability. Skin:  Skin is warm, dry and intact. No rash noted. Psychiatric: Mood and affect are normal. Speech and behavior are normal.  ____________________________________________   LABS (all labs ordered are listed, but only abnormal results are displayed)  Labs Reviewed - No data to display  PROCEDURES  Procedure(s) performed: None  Critical Care performed: No  ____________________________________________   INITIAL IMPRESSION / ASSESSMENT AND PLAN / ED COURSE  Pertinent labs & imaging results that were available during my care of the patient were reviewed by me and considered in my medical decision making (see chart for details).  Patient was told to remain on clear liquids for the remainder of the day. He was given a prescription for Zofran if needed for nausea. He was also given a sample that he was seen in the emergency room today. ____________________________________________   FINAL CLINICAL IMPRESSION(S) / ED DIAGNOSES  Final diagnoses:  Vomiting in adult patient      Tommi Rumps, PA-C 04/03/15 1833  Jeanmarie Plant, MD 04/04/15 1210

## 2015-04-11 ENCOUNTER — Encounter: Payer: Self-pay | Admitting: Emergency Medicine

## 2015-04-11 ENCOUNTER — Emergency Department
Admission: EM | Admit: 2015-04-11 | Discharge: 2015-04-11 | Disposition: A | Discharge status: Home / Self Care | Payer: Medicaid Other | Attending: Student | Admitting: Student

## 2015-04-11 DIAGNOSIS — Z79899 Other long term (current) drug therapy: Secondary | ICD-10-CM | POA: Insufficient documentation

## 2015-04-11 DIAGNOSIS — Z720 Tobacco use: Secondary | ICD-10-CM | POA: Insufficient documentation

## 2015-04-11 DIAGNOSIS — J45909 Unspecified asthma, uncomplicated: Secondary | ICD-10-CM

## 2015-04-11 DIAGNOSIS — J45901 Unspecified asthma with (acute) exacerbation: Secondary | ICD-10-CM | POA: Diagnosis not present

## 2015-04-11 DIAGNOSIS — Z7952 Long term (current) use of systemic steroids: Secondary | ICD-10-CM | POA: Diagnosis not present

## 2015-04-11 DIAGNOSIS — Z760 Encounter for issue of repeat prescription: Secondary | ICD-10-CM | POA: Insufficient documentation

## 2015-04-11 MED ORDER — ALBUTEROL SULFATE HFA 108 (90 BASE) MCG/ACT IN AERS: 2.0000 | INHALATION_SPRAY | Freq: Four times a day (QID) | RESPIRATORY_TRACT | Status: DC | PRN

## 2015-04-11 MED ORDER — IPRATROPIUM-ALBUTEROL 0.5-2.5 (3) MG/3ML IN SOLN
3.0000 mL | Freq: Once | RESPIRATORY_TRACT | Status: DC
  Filled 2015-04-11: qty 3

## 2015-04-11 NOTE — ED Notes (Signed)
Pt to ED form refill of albuterol inhaler, states he has hx of asthma and needed an inhaler today, states [place] [facility] [place] and he could not get there, no distress noted

## 2015-04-11 NOTE — ED Notes (Signed)
Pt was in a hurry to get to basketball practice that he refused his breathing treatment ordered and did not have time to sign his discharge instructions. PA aware.

## 2015-04-11 NOTE — ED Provider Notes (Signed)
[facility] Emergency Department Provider Note ____________________________________________  Time seen: Approximately 2:29 PM  I have reviewed the triage vital signs and the nursing notes.   HISTORY  Chief Complaint Medication Refill   HPI Justin [name] is a 19 y.o. male is here for refill of his albuterol inhaler. He states that the one he was given in the emergency room did not have any refills on it. He is unable to see anyone at [name] until after Christmas. He continues to smoke one pack of cigarettes per day. He denies any shortness of breath. This is usual for his regular asthma and has plans on leaving the emergency room to go to play in a basketball game.He denies any pain. Pain is 0/ 10   Past Medical History  Diagnosis Date  . Asthma     There are no active problems to display for this patient.   History reviewed. No pertinent past surgical history.  Current Outpatient Rx  Name  Route  Sig  Dispense  Refill  . albuterol (PROVENTIL HFA;VENTOLIN HFA) 108 (90 BASE) MCG/ACT inhaler   Inhalation   Inhale 2 puffs into the lungs every 6 (six) hours as needed for wheezing or shortness of breath.   1 Inhaler   2   . cetirizine (ZYRTEC) 5 MG tablet   Oral   Take 1 tablet (5 mg total) by mouth daily.   30 tablet   0   . ondansetron (ZOFRAN ODT) 4 MG disintegrating tablet   Oral   Take 1 tablet (4 mg total) by mouth every 8 (eight) hours as needed for nausea or vomiting.   20 tablet   0   . predniSONE (DELTASONE) 20 MG tablet   Oral   Take 3 tablets (60 mg total) by mouth daily.   15 tablet   0   . triamcinolone (NASACORT AQ) 55 MCG/ACT AERO nasal inhaler   Nasal   Place 1 spray into the nose 2 (two) times daily.   1 Inhaler   1     Allergies Review of patient's allergies indicates no known allergies.  No family history on file.  Social History Social History  Substance Use Topics  . Smoking status: Current Every Day  Smoker -- 0.50 packs/day    Types: Cigarettes  . Smokeless tobacco: None  . Alcohol Use: No    Review of Systems Constitutional: No fever/chills ENT: No sore throat. Cardiovascular: Denies chest pain. Respiratory: Denies shortness of breath. Positive wheezing Gastrointestinal: .  No nausea, no vomiting.  Genitourinary: Negative for dysuria. Musculoskeletal: Negative for back pain. Skin: Negative for rash. Neurological: Negative for headaches, focal weakness or numbness.  10-point ROS otherwise negative.  ____________________________________________   PHYSICAL EXAM:  VITAL SIGNS: ED Triage Vitals  Enc Vitals Group     BP 04/11/15 1351 175/92 mmHg     Pulse Rate 04/11/15 1351 85     Resp 04/11/15 1351 18     Temp 04/11/15 1351 98.7 F (37.1 C)     Temp Source 04/11/15 1351 Oral     SpO2 04/11/15 1351 95 %     Weight 04/11/15 1351 280 lb (127.007 kg)     Height 04/11/15 1351 6\' 1"  (1.854 m)     Head Cir --      Peak Flow --      Pain Score --      Pain Loc --      Pain Edu? --  Excl. in GC? --     Constitutional: Alert and oriented. Well appearing and in no acute distress. Eyes: Conjunctivae are normal. PERRL. EOMI. Head: Atraumatic. Nose: No congestion/rhinnorhea. Mouth/Throat: Mucous membranes are moist.  Oropharynx non-erythematous. Neck: No stridor.  Supple Hematological/Lymphatic/Immunilogical: No cervical lymphadenopathy. Cardiovascular: Normal rate, regular rhythm. Grossly normal heart sounds.  Good peripheral circulation. Respiratory: Normal respiratory effort.  No retractions. Lungs there is a faint wheeze expiratory heard throughout. Patient does not appear to be any respiratory distress and is talking in complete sentences. Gastrointestinal: Soft and nontender. No distention. Musculoskeletal: No lower extremity tenderness nor edema.  No joint effusions. Neurologic:  Normal speech and language. No gross focal neurologic deficits are appreciated. No  gait instability. Skin:  Skin is warm, dry and intact. No rash noted. Psychiatric: Mood and affect are normal. Speech and behavior are normal.  ____________________________________________   LABS (all labs ordered are listed, but only abnormal results are displayed)  Labs Reviewed - No data to display  PROCEDURES  Procedure(s) performed: None  Critical Care performed: No  ____________________________________________   INITIAL IMPRESSION / ASSESSMENT AND PLAN / ED COURSE  Pertinent labs & imaging results that were available during my care of the patient were reviewed by me and considered in my medical decision making (see chart for details).  Immunologic 20 was discontinued as patient had a basketball game to go to and could not stay. He is given a prescription for his albuterol inhaler. ____________________________________________   FINAL CLINICAL IMPRESSION(S) / ED DIAGNOSES  Final diagnoses:  Encounter for medication refill  Asthma, unspecified asthma severity, uncomplicated      Tommi Rumps, PA-C 04/11/15 1527  Gayla Doss, MD 04/11/15 1626

## 2015-04-11 NOTE — Discharge Instructions (Signed)
Asthma, Adult Asthma is a condition of the lungs in which the airways tighten and narrow. Asthma can make it hard to breathe. Asthma cannot be cured, but medicine and lifestyle changes can help control it. Asthma may be started (triggered) by:  Animal skin flakes (dander).  Dust.  Cockroaches.  Pollen.  Mold.  Smoke.  Cleaning products.  Hair sprays or aerosol sprays.  Paint fumes or strong smells.  Cold air, weather changes, and winds.  Crying or laughing hard.  Stress.  Certain medicines or drugs.  Foods, such as dried fruit, potato chips, and sparkling grape juice.  Infections or conditions (colds, flu).  Exercise.  Certain medical conditions or diseases.  Exercise or tiring activities. HOME CARE   Take medicine as told by your doctor.  Use a peak flow meter as told by your doctor. A peak flow meter is a tool that measures how well the lungs are working.  Record and keep track of the peak flow meter's readings.  Understand and use the asthma action plan. An asthma action plan is a written plan for taking care of your asthma and treating your attacks.  To help prevent asthma attacks:  Do not smoke. Stay away from secondhand smoke.  Change your heating and air conditioning filter often.  Limit your use of fireplaces and wood stoves.  Get rid of pests (such as roaches and mice) and their droppings.  Throw away plants if you see mold on them.  Clean your floors. Dust regularly. Use cleaning products that do not smell.  Have someone vacuum when you are not home. Use a vacuum cleaner with a HEPA filter if possible.  Replace carpet with wood, tile, or vinyl flooring. Carpet can trap animal skin flakes and dust.  Use allergy-proof pillows, mattress covers, and box spring covers.  Wash bed sheets and blankets every week in hot water and dry them in a dryer.  Use blankets that are made of polyester or cotton.  Clean bathrooms and kitchens with bleach.  If possible, have someone repaint the walls in these rooms with mold-resistant paint. Keep out of the rooms that are being cleaned and painted.  Wash hands often. GET HELP IF:  You have make a whistling sound when breaking (wheeze), have shortness of breath, or have a cough even if taking medicine to prevent attacks.  The colored mucus you cough up (sputum) is thicker than usual.  The colored mucus you cough up changes from clear or white to yellow, green, gray, or bloody.  You have problems from the medicine you are taking such as:  A rash.  Itching.  Swelling.  Trouble breathing.  You need reliever medicines more than 2-3 times a week.  Your peak flow measurement is still at 50-79% of your personal best after following the action plan for 1 hour.  You have a fever. GET HELP RIGHT AWAY IF:   You seem to be worse and are not responding to medicine during an asthma attack.  You are short of breath even at rest.  You get short of breath when doing very little activity.  You have trouble eating, drinking, or talking.  You have chest pain.  You have a fast heartbeat.  Your lips or fingernails start to turn blue.  You are light-headed, dizzy, or faint.  Your peak flow is less than 50% of your personal best.   This information is not intended to replace advice given to you by your health care provider. Make sure   you discuss any questions you have with your health care provider.   Document Released: 11/04/2007 Document Revised: 02/06/2015 Document Reviewed: 12/15/2012 Elsevier Interactive Patient Education 2016 [employer]  

## 2015-04-11 NOTE — ED Notes (Signed)
Pt returns after two- three weeks for a refill on his albuterol inhaler. Pt states unable to get in [facility] until after Christmas. Pt states he still has a cold and has used it all and we did not give him any refills with the initial script.  No acute distress noted to pt.

## 2015-04-26 ENCOUNTER — Emergency Department
Admission: EM | Admit: 2015-04-26 | Discharge: 2015-04-26 | Disposition: A | Discharge status: Home / Self Care | Payer: Medicaid Other | Attending: Emergency Medicine | Admitting: Emergency Medicine

## 2015-04-26 DIAGNOSIS — F17210 Nicotine dependence, cigarettes, uncomplicated: Secondary | ICD-10-CM | POA: Diagnosis not present

## 2015-04-26 DIAGNOSIS — Z760 Encounter for issue of repeat prescription: Secondary | ICD-10-CM | POA: Diagnosis present

## 2015-04-26 DIAGNOSIS — J45901 Unspecified asthma with (acute) exacerbation: Secondary | ICD-10-CM | POA: Insufficient documentation

## 2015-04-26 DIAGNOSIS — Z79899 Other long term (current) drug therapy: Secondary | ICD-10-CM | POA: Diagnosis not present

## 2015-04-26 MED ORDER — PREDNISONE 50 MG PO TABS: 50.0000 mg | ORAL_TABLET | Freq: Every day | ORAL | Status: DC

## 2015-04-26 MED ORDER — IPRATROPIUM-ALBUTEROL 0.5-2.5 (3) MG/3ML IN SOLN
RESPIRATORY_TRACT | Status: AC
  Filled 2015-04-26: qty 3

## 2015-04-26 MED ORDER — ALBUTEROL SULFATE HFA 108 (90 BASE) MCG/ACT IN AERS: 2.0000 | INHALATION_SPRAY | Freq: Four times a day (QID) | RESPIRATORY_TRACT | Status: DC | PRN

## 2015-04-26 MED ORDER — IPRATROPIUM-ALBUTEROL 0.5-2.5 (3) MG/3ML IN SOLN
3.0000 mL | Freq: Once | RESPIRATORY_TRACT | Status: AC
  Administered 2015-04-26: 3 mL via RESPIRATORY_TRACT

## 2015-04-26 NOTE — ED Notes (Signed)
Pt states he needs a Rx for proAir

## 2015-04-26 NOTE — ED Notes (Signed)
Neb completed.  Patient states "I feel better".  AAOx3.  No SOB/ DOE/  Voice clear and strong.

## 2015-04-26 NOTE — Discharge Instructions (Signed)
Asthma, Adult Asthma is a recurring condition in which the airways tighten and narrow. Asthma can make it difficult to breathe. It can cause coughing, wheezing, and shortness of breath. Asthma episodes, also called asthma attacks, range from minor to life-threatening. Asthma cannot be cured, but medicines and lifestyle changes can help control it. CAUSES Asthma is believed to be caused by inherited (genetic) and environmental factors, but its exact cause is unknown. Asthma may be triggered by allergens, lung infections, or irritants in the air. Asthma triggers are different for each person. Common triggers include:   Animal dander.  Dust mites.  Cockroaches.  Pollen from trees or grass.  Mold.  Smoke.  Air pollutants such as dust, household cleaners, hair sprays, aerosol sprays, paint fumes, strong chemicals, or strong odors.  Cold air, weather changes, and winds (which increase molds and pollens in the air).  Strong emotional expressions such as crying or laughing hard.  Stress.  Certain medicines (such as aspirin) or types of drugs (such as beta-blockers).  Sulfites in foods and drinks. Foods and drinks that may contain sulfites include dried fruit, potato chips, and sparkling grape juice.  Infections or inflammatory conditions such as the flu, a cold, or an inflammation of the nasal membranes (rhinitis).  Gastroesophageal reflux disease (GERD).  Exercise or strenuous activity. SYMPTOMS Symptoms may occur immediately after asthma is triggered or many hours later. Symptoms include:  Wheezing.  Excessive nighttime or early morning coughing.  Frequent or severe coughing with a common cold.  Chest tightness.  Shortness of breath. DIAGNOSIS  The diagnosis of asthma is made by a review of your medical history and a physical exam. Tests may also be performed. These may include:  Lung function studies. These tests show how much air you breathe in and out.  Allergy  tests.  Imaging tests such as X-rays. TREATMENT  Asthma cannot be cured, but it can usually be controlled. Treatment involves identifying and avoiding your asthma triggers. It also involves medicines. There are 2 classes of medicine used for asthma treatment:   Controller medicines. These prevent asthma symptoms from occurring. They are usually taken every day.  Reliever or rescue medicines. These quickly relieve asthma symptoms. They are used as needed and provide short-term relief. Your health care provider will help you create an asthma action plan. An asthma action plan is a written plan for managing and treating your asthma attacks. It includes a list of your asthma triggers and how they may be avoided. It also includes information on when medicines should be taken and when their dosage should be changed. An action plan may also involve the use of a device called a peak flow meter. A peak flow meter measures how well the lungs are working. It helps you monitor your condition. HOME CARE INSTRUCTIONS   Take medicines only as directed by your health care provider. Speak with your health care provider if you have questions about how or when to take the medicines.  Use a peak flow meter as directed by your health care provider. Record and keep track of readings.  Understand and use the action plan to help minimize or stop an asthma attack without needing to seek medical care.  Control your home environment in the following ways to help prevent asthma attacks:  Do not smoke. Avoid being exposed to secondhand smoke.  Change your heating and air conditioning filter regularly.  Limit your use of fireplaces and wood stoves.  Get rid of pests (such as roaches   and mice) and their droppings.  Throw away plants if you see mold on them.  Clean your floors and dust regularly. Use unscented cleaning products.  Try to have someone else vacuum for you regularly. Stay out of rooms while they are  being vacuumed and for a short while afterward. If you vacuum, use a dust mask from a hardware store, a double-layered or microfilter vacuum cleaner bag, or a vacuum cleaner with a HEPA filter.  Replace carpet with wood, tile, or vinyl flooring. Carpet can trap dander and dust.  Use allergy-proof pillows, mattress covers, and box spring covers.  Wash bed sheets and blankets every week in hot water and dry them in a dryer.  Use blankets that are made of polyester or cotton.  Clean bathrooms and kitchens with bleach. If possible, have someone repaint the walls in these rooms with mold-resistant paint. Keep out of the rooms that are being cleaned and painted.  Wash hands frequently. SEEK MEDICAL CARE IF:   You have wheezing, shortness of breath, or a cough even if taking medicine to prevent attacks.  The colored mucus you cough up (sputum) is thicker than usual.  Your sputum changes from clear or white to yellow, green, gray, or bloody.  You have any problems that may be related to the medicines you are taking (such as a rash, itching, swelling, or trouble breathing).  You are using a reliever medicine more than 2-3 times per week.  Your peak flow is still at 50-79% of your personal best after following your action plan for 1 hour.  You have a fever. SEEK IMMEDIATE MEDICAL CARE IF:   You seem to be getting worse and are unresponsive to treatment during an asthma attack.  You are short of breath even at rest.  You get short of breath when doing very little physical activity.  You have difficulty eating, drinking, or talking due to asthma symptoms.  You develop chest pain.  You develop a fast heartbeat.  You have a bluish color to your lips or fingernails.  You are light-headed, dizzy, or faint.  Your peak flow is less than 50% of your personal best.   This information is not intended to replace advice given to you by your health care provider. Make sure you discuss any  questions you have with your health care provider.   Document Released: 05/18/2005 Document Revised: 02/06/2015 Document Reviewed: 12/15/2012 Elsevier Interactive Patient Education 2016 Elsevier Inc.  

## 2015-04-26 NOTE — ED Provider Notes (Signed)
[facility] Emergency Department Provider Note  ____________________________________________  Time seen: On arrival  I have reviewed the triage vital signs and the nursing notes.   HISTORY  Chief Complaint Medication Refill    HPI Justin [name] is a 19 y.o. male who presents for refill for his albuterol inhaler.He notes he has been feeling tight and wheezing and needs his description because he is out of his medication. Patient does frequently come to the emergency department for refills. He reports he is in a hurry to make it the basketball practice. He denies fevers chills, no cough. No recent travel    Past Medical History  Diagnosis Date  . Asthma     There are no active problems to display for this patient.   History reviewed. No pertinent past surgical history.  Current Outpatient Rx  Name  Route  Sig  Dispense  Refill  . albuterol (PROVENTIL HFA;VENTOLIN HFA) 108 (90 BASE) MCG/ACT inhaler   Inhalation   Inhale 2 puffs into the lungs every 6 (six) hours as needed for wheezing or shortness of breath.   1 Inhaler   2   . cetirizine (ZYRTEC) 5 MG tablet   Oral   Take 1 tablet (5 mg total) by mouth daily.   30 tablet   0   . ondansetron (ZOFRAN ODT) 4 MG disintegrating tablet   Oral   Take 1 tablet (4 mg total) by mouth every 8 (eight) hours as needed for nausea or vomiting.   20 tablet   0   . predniSONE (DELTASONE) 20 MG tablet   Oral   Take 3 tablets (60 mg total) by mouth daily.   15 tablet   0   . triamcinolone (NASACORT AQ) 55 MCG/ACT AERO nasal inhaler   Nasal   Place 1 spray into the nose 2 (two) times daily.   1 Inhaler   1     Allergies Review of patient's allergies indicates no known allergies.  No family history on file.  Social History Social History  Substance Use Topics  . Smoking status: Current Every Day Smoker -- 0.50 packs/day    Types: Cigarettes  . Smokeless tobacco: None  . Alcohol Use: No     Review of Systems  Constitutional: Negative for fever.  ENT: Negative for sore throat Cardiovascular: Negative for chest pain Respiratory: Negative for cough  Musculoskeletal: Negative for back pain. Skin: Negative for rash. Neurological: Negative for headaches    ____________________________________________   PHYSICAL EXAM:  VITAL SIGNS: ED Triage Vitals  Enc Vitals Group     BP --      Pulse --      Resp --      Temp --      Temp src --      SpO2 --      Weight --      Height --      Head Cir --      Peak Flow --      Pain Score --      Pain Loc --      Pain Edu? --      Excl. in GC? --      Constitutional: Alert and oriented. Well appearing and in no distress. Eyes: Conjunctivae are normal.  ENT   Head: Normocephalic and atraumatic.   Mouth/Throat: Mucous membranes are moist. Cardiovascular: Normal rate, regular rhythm.  Respiratory: Normal respiratory effort without tachypnea nor retractions. Bilateral wheezes in upper and lower lobes .  Musculoskeletal: Nontender with normal range of motion in all extremities. Neurologic:  Normal speech and language. No gross focal neurologic deficits are appreciated. Skin:  Skin is warm, dry and intact. No rash noted. Psychiatric: Mood and affect are normal. Patient exhibits appropriate insight and judgment.  ____________________________________________    LABS (pertinent positives/negatives)  Labs Reviewed - No data to display  ____________________________________________     ____________________________________________    RADIOLOGY I have personally reviewed any xrays that were ordered on this patient: None  ____________________________________________   PROCEDURES  Procedure(s) performed: none   ____________________________________________   INITIAL IMPRESSION / ASSESSMENT AND PLAN / ED COURSE  Pertinent labs & imaging results that were available during my care of the patient were  reviewed by me and considered in my medical decision making (see chart for details).  Patient is anxious to leave but he is wheezing in both lungs. Convinced him to stay for DuoNeb treatment   ----------------------------------------- 7:18 PM on 04/26/2015 -----------------------------------------  Patient with significant improvement after DuoNeb. We will discharge him with prednisone and albuterol refill ____________________________________________   FINAL CLINICAL IMPRESSION(S) / ED DIAGNOSES  Final diagnoses:  Asthma exacerbation     Jene Every, MD 04/26/15 [phone]

## 2015-04-30 ENCOUNTER — Emergency Department
Admission: EM | Admit: 2015-04-30 | Discharge: 2015-04-30 | Disposition: A | Discharge status: Home / Self Care | Payer: Medicaid Other | Attending: Emergency Medicine | Admitting: Emergency Medicine

## 2015-04-30 ENCOUNTER — Encounter: Payer: Self-pay | Admitting: Emergency Medicine

## 2015-04-30 DIAGNOSIS — F17210 Nicotine dependence, cigarettes, uncomplicated: Secondary | ICD-10-CM | POA: Diagnosis not present

## 2015-04-30 DIAGNOSIS — Z79899 Other long term (current) drug therapy: Secondary | ICD-10-CM | POA: Diagnosis not present

## 2015-04-30 DIAGNOSIS — R300 Dysuria: Secondary | ICD-10-CM | POA: Diagnosis present

## 2015-04-30 DIAGNOSIS — Z7952 Long term (current) use of systemic steroids: Secondary | ICD-10-CM | POA: Insufficient documentation

## 2015-04-30 LAB — URINALYSIS COMPLETE WITH MICROSCOPIC (ARMC ONLY)
BILIRUBIN URINE: NEGATIVE
Bacteria, UA: NONE SEEN
GLUCOSE, UA: NEGATIVE mg/dL
Hgb urine dipstick: NEGATIVE
KETONES UR: NEGATIVE mg/dL
Leukocytes, UA: NEGATIVE
NITRITE: NEGATIVE
Protein, ur: NEGATIVE mg/dL
RBC / HPF: NONE SEEN RBC/hpf (ref 0–5)
SPECIFIC GRAVITY, URINE: 1.018 (ref 1.005–1.030)
pH: 7 (ref 5.0–8.0)

## 2015-04-30 LAB — CHLAMYDIA/NGC RT PCR (ARMC ONLY)
CHLAMYDIA TR: NOT DETECTED
N gonorrhoeae: NOT DETECTED

## 2015-04-30 MED ORDER — SULFAMETHOXAZOLE-TRIMETHOPRIM 800-160 MG PO TABS: 1.0000 | ORAL_TABLET | Freq: Two times a day (BID) | ORAL | Status: DC

## 2015-04-30 MED ORDER — PHENAZOPYRIDINE HCL 200 MG PO TABS: 200.0000 mg | ORAL_TABLET | Freq: Three times a day (TID) | ORAL | Status: DC | PRN

## 2015-04-30 NOTE — ED Notes (Signed)
Pain with urination.  Onset of symptoms last night.

## 2015-04-30 NOTE — ED Notes (Signed)
Assessment per PA 

## 2015-04-30 NOTE — ED Provider Notes (Signed)
[facility] Emergency Department Provider Note  ____________________________________________  Time seen: Approximately 12:34 PM  I have reviewed the triage vital signs and the nursing notes.   HISTORY  Chief Complaint Dysuria    HPI Justin [name] is a 19 y.o. male since for evaluation of dysuria onset last night. Denies any trauma or discharge. Hurts to burn only. Patient is sexually active at this time. With one partner.   Past Medical History  Diagnosis Date  . Asthma     There are no active problems to display for this patient.   History reviewed. No pertinent past surgical history.  Current Outpatient Rx  Name  Route  Sig  Dispense  Refill  . albuterol (PROVENTIL HFA;VENTOLIN HFA) 108 (90 BASE) MCG/ACT inhaler   Inhalation   Inhale 2 puffs into the lungs every 6 (six) hours as needed for wheezing or shortness of breath.   1 Inhaler   2   . cetirizine (ZYRTEC) 5 MG tablet   Oral   Take 1 tablet (5 mg total) by mouth daily.   30 tablet   0   . ondansetron (ZOFRAN ODT) 4 MG disintegrating tablet   Oral   Take 1 tablet (4 mg total) by mouth every 8 (eight) hours as needed for nausea or vomiting.   20 tablet   0   . phenazopyridine (PYRIDIUM) 200 MG tablet   Oral   Take 1 tablet (200 mg total) by mouth 3 (three) times daily as needed for pain.   6 tablet   0   . predniSONE (DELTASONE) 50 MG tablet   Oral   Take 1 tablet (50 mg total) by mouth daily with breakfast.   5 tablet   0   . sulfamethoxazole-trimethoprim (BACTRIM DS,SEPTRA DS) 800-160 MG tablet   Oral   Take 1 tablet by mouth 2 (two) times daily.   6 tablet   0   . triamcinolone (NASACORT AQ) 55 MCG/ACT AERO nasal inhaler   Nasal   Place 1 spray into the nose 2 (two) times daily.   1 Inhaler   1     Allergies Review of patient's allergies indicates no known allergies.  No family history on file.  Social History Social History  Substance Use Topics  .  Smoking status: Current Every Day Smoker -- 0.50 packs/day    Types: Cigarettes  . Smokeless tobacco: None  . Alcohol Use: No    Review of Systems Constitutional: No fever/chills Eyes: No visual changes. ENT: No sore throat. Cardiovascular: Denies chest pain. Respiratory: Denies shortness of breath. Gastrointestinal: No abdominal pain.  No nausea, no vomiting.  No diarrhea.  No constipation. Genitourinary: Positive for dysuria Musculoskeletal: Negative for back pain. Skin: Negative for rash. Neurological: Negative for headaches, focal weakness or numbness.  10-point ROS otherwise negative.  ____________________________________________   PHYSICAL EXAM:  VITAL SIGNS: ED Triage Vitals  Enc Vitals Group     BP 04/30/15 1212 165/112 mmHg     Pulse Rate 04/30/15 1212 65     Resp 04/30/15 1212 18     Temp 04/30/15 1212 97.8 F (36.6 C)     Temp Source 04/30/15 1212 Oral     SpO2 04/30/15 1212 98 %     Weight 04/30/15 1212 250 lb (113.399 kg)     Height 04/30/15 1212 6\' 1"  (1.854 m)     Head Cir --      Peak Flow --      Pain Score  04/30/15 1213 8     Pain Loc --      Pain Edu? --      Excl. in GC? --     Constitutional: Alert and oriented. Well appearing and in no acute distress. Cardiovascular: Normal rate, regular rhythm. Grossly normal heart sounds.  Good peripheral circulation. Respiratory: Normal respiratory effort.  No retractions. Lungs CTAB. Gastrointestinal: Soft and nontender. No distention. No abdominal bruits. No CVA tenderness. Musculoskeletal: No lower extremity tenderness nor edema.  No joint effusions. Neurologic:  Normal speech and language. No gross focal neurologic deficits are appreciated. No gait instability. Skin:  Skin is warm, dry and intact. No rash noted. Psychiatric: Mood and affect are normal. Speech and behavior are normal.  ____________________________________________   LABS (all labs ordered are listed, but only abnormal results are  displayed)  Labs Reviewed  URINALYSIS COMPLETEWITH MICROSCOPIC ([facility] ONLY) - Abnormal; Notable for the following:    Color, Urine YELLOW (*)    APPearance CLEAR (*)    Squamous Epithelial / LPF 6-30 (*)    All other components within normal limits  CHLAMYDIA/NGC RT PCR ([facility] ONLY)   ____________________________________________   PROCEDURES  Procedure(s) performed: None  Critical Care performed: No  ____________________________________________   INITIAL IMPRESSION / ASSESSMENT AND PLAN / ED COURSE  Pertinent labs & imaging results that were available during my care of the patient were reviewed by me and considered in my medical decision making (see chart for details).  Acute dysuria. Despite normal urinalysis will treat with Bactrim DS twice a day for 3 days and Pyridium 200 mg 3 times a day. GC/Chlamydia are pending,however patient states no discharge noted. Patient voices no other emergency medical complaints at this time will return to the ER with any worsening or immediately developed symptomology.  Just an addendum continued elevation of blood pressure noted while in the ED. Encouraged patient to follow up with PCP at the [place] [facility] to be evaluated possibly started on antihypertensive medications. Patient asymptomatic for high blood pressure at this time. ____________________________________________   FINAL CLINICAL IMPRESSION(S) / ED DIAGNOSES  Final diagnoses:  Dysuria      Evangeline Dakin, PA-C 04/30/15 1323  Evangeline Dakin, PA-C 04/30/15 1325  Sharman Cheek, MD 04/30/15 1601

## 2015-04-30 NOTE — Discharge Instructions (Signed)

## 2015-05-08 ENCOUNTER — Emergency Department
Admission: EM | Admit: 2015-05-08 | Discharge: 2015-05-08 | Disposition: A | Discharge status: Home / Self Care | Payer: Medicaid Other | Attending: Emergency Medicine | Admitting: Emergency Medicine

## 2015-05-08 ENCOUNTER — Encounter: Payer: Self-pay | Admitting: Urgent Care

## 2015-05-08 DIAGNOSIS — F17210 Nicotine dependence, cigarettes, uncomplicated: Secondary | ICD-10-CM | POA: Insufficient documentation

## 2015-05-08 DIAGNOSIS — Z7952 Long term (current) use of systemic steroids: Secondary | ICD-10-CM | POA: Insufficient documentation

## 2015-05-08 DIAGNOSIS — Z792 Long term (current) use of antibiotics: Secondary | ICD-10-CM | POA: Insufficient documentation

## 2015-05-08 DIAGNOSIS — Z79899 Other long term (current) drug therapy: Secondary | ICD-10-CM | POA: Insufficient documentation

## 2015-05-08 DIAGNOSIS — J45901 Unspecified asthma with (acute) exacerbation: Secondary | ICD-10-CM | POA: Insufficient documentation

## 2015-05-08 DIAGNOSIS — Z7951 Long term (current) use of inhaled steroids: Secondary | ICD-10-CM | POA: Insufficient documentation

## 2015-05-08 DIAGNOSIS — R062 Wheezing: Secondary | ICD-10-CM | POA: Diagnosis present

## 2015-05-08 MED ORDER — IPRATROPIUM-ALBUTEROL 0.5-2.5 (3) MG/3ML IN SOLN
3.0000 mL | Freq: Once | RESPIRATORY_TRACT | Status: AC
  Administered 2015-05-08: 3 mL via RESPIRATORY_TRACT

## 2015-05-08 MED ORDER — IPRATROPIUM-ALBUTEROL 0.5-2.5 (3) MG/3ML IN SOLN
RESPIRATORY_TRACT | Status: DC
Start: 2015-05-08 — End: 2015-05-09
  Filled 2015-05-08: qty 3

## 2015-05-08 MED ORDER — ALBUTEROL SULFATE HFA 108 (90 BASE) MCG/ACT IN AERS: 2.0000 | INHALATION_SPRAY | Freq: Four times a day (QID) | RESPIRATORY_TRACT | Status: DC | PRN

## 2015-05-08 MED ORDER — IPRATROPIUM-ALBUTEROL 0.5-2.5 (3) MG/3ML IN SOLN
RESPIRATORY_TRACT | Status: AC
Start: 2015-05-08 — End: 2015-05-08
  Filled 2015-05-08: qty 3

## 2015-05-08 NOTE — ED Provider Notes (Signed)
[facility] Emergency Department Provider Note  ____________________________________________  Time seen: Approximately 9:28 PM  I have reviewed the triage vital signs and the nursing notes.   HISTORY  Chief Complaint Wheezing   HPI Justin [name] is a 19 y.o. male resistance to the emergency department for evaluation of wheezing. He states that he is out of his albuterol. He states that he noticed the wheezing started while at [employer] and it has continued. He denies other symptoms such as cough or fever. He denies any pain.   Past Medical History  Diagnosis Date  . Asthma     There are no active problems to display for this patient.   History reviewed. No pertinent past surgical history.  Current Outpatient Rx  Name  Route  Sig  Dispense  Refill  . albuterol (PROVENTIL HFA;VENTOLIN HFA) 108 (90 BASE) MCG/ACT inhaler   Inhalation   Inhale 2 puffs into the lungs every 6 (six) hours as needed for wheezing or shortness of breath.   1 Inhaler   2   . cetirizine (ZYRTEC) 5 MG tablet   Oral   Take 1 tablet (5 mg total) by mouth daily.   30 tablet   0   . ondansetron (ZOFRAN ODT) 4 MG disintegrating tablet   Oral   Take 1 tablet (4 mg total) by mouth every 8 (eight) hours as needed for nausea or vomiting.   20 tablet   0   . phenazopyridine (PYRIDIUM) 200 MG tablet   Oral   Take 1 tablet (200 mg total) by mouth 3 (three) times daily as needed for pain.   6 tablet   0   . predniSONE (DELTASONE) 50 MG tablet   Oral   Take 1 tablet (50 mg total) by mouth daily with breakfast.   5 tablet   0   . sulfamethoxazole-trimethoprim (BACTRIM DS,SEPTRA DS) 800-160 MG tablet   Oral   Take 1 tablet by mouth 2 (two) times daily.   6 tablet   0   . triamcinolone (NASACORT AQ) 55 MCG/ACT AERO nasal inhaler   Nasal   Place 1 spray into the nose 2 (two) times daily.   1 Inhaler   1     Allergies Review of patient's allergies indicates no  known allergies.  No family history on file.  Social History Social History  Substance Use Topics  . Smoking status: Current Every Day Smoker -- 0.50 packs/day    Types: Cigarettes  . Smokeless tobacco: None  . Alcohol Use: No    Review of Systems Constitutional: No fever/chills Eyes: No visual changes. ENT: No sore throat. Cardiovascular: Denies chest pain. Respiratory: Mild shortness of breath. Patient will for cough. Gastrointestinal: Negative for abdominal pain. Negative for nausea,  no vomiting.  No diarrhea.  Genitourinary: Negative for dysuria. Musculoskeletal: Negative for body aches Skin: Negative for rash. Neurological: Negative for headaches, Negative for focal weakness or numbness.  10-point ROS otherwise negative.  ____________________________________________   PHYSICAL EXAM:  VITAL SIGNS: ED Triage Vitals  Enc Vitals Group     BP 05/08/15 2058 156/106 mmHg     Pulse Rate 05/08/15 2058 84     Resp 05/08/15 2058 18     Temp 05/08/15 2058 97.4 F (36.3 C)     Temp Source 05/08/15 2058 Oral     SpO2 05/08/15 2058 96 %     Weight --      Height --      Head  Cir --      Peak Flow --      Pain Score 05/08/15 2102 0     Pain Loc --      Pain Edu? --      Excl. in GC? --     Constitutional: Alert and oriented. Well appearing and in no acute distress. Eyes: Conjunctivae are normal. PERRL. EOMI. Head: Atraumatic. Nose: No congestion/rhinnorhea. Mouth/Throat: Mucous membranes are moist.  Oropharynx non-erythematous. Neck: No stridor.  Lymphatic: No cervical lymphadenopathy. Cardiovascular: Normal rate, regular rhythm. Grossly normal heart sounds.  Good peripheral circulation. Respiratory: Normal respiratory effort.  No retractions. Lungs expiratory wheezes noted throughout with good air exchange. Gastrointestinal: Soft and nontender. No distention. No abdominal bruits. No CVA tenderness. Musculoskeletal: No joint pain reported. Neurologic:  Normal  speech and language. No gross focal neurologic deficits are appreciated. Speech is normal. No gait instability. Skin:  Skin is warm, dry and intact. No rash noted. Psychiatric: Mood and affect are normal. Speech and behavior are normal.  ____________________________________________   LABS (all labs ordered are listed, but only abnormal results are displayed)  Labs Reviewed - No data to display ____________________________________________  EKG   ____________________________________________  RADIOLOGY   ____________________________________________   PROCEDURES  Procedure(s) performed:   Critical Care performed:   ____________________________________________   INITIAL IMPRESSION / ASSESSMENT AND PLAN / ED COURSE  Pertinent labs & imaging results that were available during my care of the patient were reviewed by me and considered in my medical decision making (see chart for details).   DuoNeb SVN given in the emergency department with near complete resolution of wheezing. Patient will be given a prescription for his albuterol. He was advised to follow-up with his provider at [facility] or return to the emergency department for symptoms that change or worsen. ____________________________________________   FINAL CLINICAL IMPRESSION(S) / ED DIAGNOSES  Final diagnoses:  Asthma exacerbation       Chinita PesterCari B Analucia Hush, FNP 05/08/15 2314  Emily FilbertJonathan E Williams, MD 05/08/15 [phone]

## 2015-05-08 NOTE — Discharge Instructions (Signed)
Asthma, Adult Asthma is a condition of the lungs in which the airways tighten and narrow. Asthma can make it hard to breathe. Asthma cannot be cured, but medicine and lifestyle changes can help control it. Asthma may be started (triggered) by:  Animal skin flakes (dander).  Dust.  Cockroaches.  Pollen.  Mold.  Smoke.  Cleaning products.  Hair sprays or aerosol sprays.  Paint fumes or strong smells.  Cold air, weather changes, and winds.  Crying or laughing hard.  Stress.  Certain medicines or drugs.  Foods, such as dried fruit, potato chips, and sparkling grape juice.  Infections or conditions (colds, flu).  Exercise.  Certain medical conditions or diseases.  Exercise or tiring activities. HOME CARE   Take medicine as told by your doctor.  Use a peak flow meter as told by your doctor. A peak flow meter is a tool that measures how well the lungs are working.  Record and keep track of the peak flow meter's readings.  Understand and use the asthma action plan. An asthma action plan is a written plan for taking care of your asthma and treating your attacks.  To help prevent asthma attacks:  Do not smoke. Stay away from secondhand smoke.  Change your heating and air conditioning filter often.  Limit your use of fireplaces and wood stoves.  Get rid of pests (such as roaches and mice) and their droppings.  Throw away plants if you see mold on them.  Clean your floors. Dust regularly. Use cleaning products that do not smell.  Have someone vacuum when you are not home. Use a vacuum cleaner with a HEPA filter if possible.  Replace carpet with wood, tile, or vinyl flooring. Carpet can trap animal skin flakes and dust.  Use allergy-proof pillows, mattress covers, and box spring covers.  Wash bed sheets and blankets every week in hot water and dry them in a dryer.  Use blankets that are made of polyester or cotton.  Clean bathrooms and kitchens with bleach.  If possible, have someone repaint the walls in these rooms with mold-resistant paint. Keep out of the rooms that are being cleaned and painted.  Wash hands often. GET HELP IF:  You have make a whistling sound when breaking (wheeze), have shortness of breath, or have a cough even if taking medicine to prevent attacks.  The colored mucus you cough up (sputum) is thicker than usual.  The colored mucus you cough up changes from clear or white to yellow, green, gray, or bloody.  You have problems from the medicine you are taking such as:  A rash.  Itching.  Swelling.  Trouble breathing.  You need reliever medicines more than 2-3 times a week.  Your peak flow measurement is still at 50-79% of your personal best after following the action plan for 1 hour.  You have a fever. GET HELP RIGHT AWAY IF:   You seem to be worse and are not responding to medicine during an asthma attack.  You are short of breath even at rest.  You get short of breath when doing very little activity.  You have trouble eating, drinking, or talking.  You have chest pain.  You have a fast heartbeat.  Your lips or fingernails start to turn blue.  You are light-headed, dizzy, or faint.  Your peak flow is less than 50% of your personal best.   This information is not intended to replace advice given to you by your health care provider. Make sure   you discuss any questions you have with your health care provider.   Document Released: 11/04/2007 Document Revised: 02/06/2015 Document Reviewed: 12/15/2012 Elsevier Interactive Patient Education 2016 [employer]  

## 2015-05-08 NOTE — ED Notes (Signed)
Patient presents with c/o wheezing that started tonight while at church. Patient requesting a SVN and a refill on his Albuterol. NAD noted in triage; speaking in complete sentences.

## 2015-05-27 ENCOUNTER — Encounter: Payer: Self-pay | Admitting: Urgent Care

## 2015-05-27 ENCOUNTER — Emergency Department
Admission: EM | Admit: 2015-05-27 | Discharge: 2015-05-27 | Disposition: A | Discharge status: Home / Self Care | Payer: Medicaid Other | Attending: Emergency Medicine | Admitting: Emergency Medicine

## 2015-05-27 DIAGNOSIS — Z760 Encounter for issue of repeat prescription: Secondary | ICD-10-CM

## 2015-05-27 DIAGNOSIS — J4530 Mild persistent asthma, uncomplicated: Secondary | ICD-10-CM | POA: Diagnosis not present

## 2015-05-27 DIAGNOSIS — F17210 Nicotine dependence, cigarettes, uncomplicated: Secondary | ICD-10-CM | POA: Insufficient documentation

## 2015-05-27 DIAGNOSIS — Z79899 Other long term (current) drug therapy: Secondary | ICD-10-CM | POA: Diagnosis not present

## 2015-05-27 DIAGNOSIS — Z7952 Long term (current) use of systemic steroids: Secondary | ICD-10-CM | POA: Diagnosis not present

## 2015-05-27 MED ORDER — ALBUTEROL SULFATE HFA 108 (90 BASE) MCG/ACT IN AERS: 2.0000 | INHALATION_SPRAY | Freq: Four times a day (QID) | RESPIRATORY_TRACT | Status: DC | PRN

## 2015-05-27 NOTE — ED Notes (Signed)
Patient presents stating, "I am here to get my refill on my Albuterol inhaler". Patient reports that he has been out of medication for "the last few hours" - recent cold symptoms; denies fever. Patient in NAD in triage; able to speak in complete sentences.

## 2015-05-27 NOTE — Discharge Instructions (Signed)
Asthma, Adult Asthma is a recurring condition in which the airways tighten and narrow. Asthma can make it difficult to breathe. It can cause coughing, wheezing, and shortness of breath. Asthma episodes, also called asthma attacks, range from minor to life-threatening. Asthma cannot be cured, but medicines and lifestyle changes can help control it. CAUSES Asthma is believed to be caused by inherited (genetic) and environmental factors, but its exact cause is unknown. Asthma may be triggered by allergens, lung infections, or irritants in the air. Asthma triggers are different for each person. Common triggers include:   Animal dander.  Dust mites.  Cockroaches.  Pollen from trees or grass.  Mold.  Smoke.  Air pollutants such as dust, household cleaners, hair sprays, aerosol sprays, paint fumes, strong chemicals, or strong odors.  Cold air, weather changes, and winds (which increase molds and pollens in the air).  Strong emotional expressions such as crying or laughing hard.  Stress.  Certain medicines (such as aspirin) or types of drugs (such as beta-blockers).  Sulfites in foods and drinks. Foods and drinks that may contain sulfites include dried fruit, potato chips, and sparkling grape juice.  Infections or inflammatory conditions such as the flu, a cold, or an inflammation of the nasal membranes (rhinitis).  Gastroesophageal reflux disease (GERD).  Exercise or strenuous activity. SYMPTOMS Symptoms may occur immediately after asthma is triggered or many hours later. Symptoms include:  Wheezing.  Excessive nighttime or early morning coughing.  Frequent or severe coughing with a common cold.  Chest tightness.  Shortness of breath. DIAGNOSIS  The diagnosis of asthma is made by a review of your medical history and a physical exam. Tests may also be performed. These may include:  Lung function studies. These tests show how much air you breathe in and out.  Allergy  tests.  Imaging tests such as X-rays. TREATMENT  Asthma cannot be cured, but it can usually be controlled. Treatment involves identifying and avoiding your asthma triggers. It also involves medicines. There are 2 classes of medicine used for asthma treatment:   Controller medicines. These prevent asthma symptoms from occurring. They are usually taken every day.  Reliever or rescue medicines. These quickly relieve asthma symptoms. They are used as needed and provide short-term relief. Your health care provider will help you create an asthma action plan. An asthma action plan is a written plan for managing and treating your asthma attacks. It includes a list of your asthma triggers and how they may be avoided. It also includes information on when medicines should be taken and when their dosage should be changed. An action plan may also involve the use of a device called a peak flow meter. A peak flow meter measures how well the lungs are working. It helps you monitor your condition. HOME CARE INSTRUCTIONS   Take medicines only as directed by your health care provider. Speak with your health care provider if you have questions about how or when to take the medicines.  Use a peak flow meter as directed by your health care provider. Record and keep track of readings.  Understand and use the action plan to help minimize or stop an asthma attack without needing to seek medical care.  Control your home environment in the following ways to help prevent asthma attacks:  Do not smoke. Avoid being exposed to secondhand smoke.  Change your heating and air conditioning filter regularly.  Limit your use of fireplaces and wood stoves.  Get rid of pests (such as roaches   and mice) and their droppings.  Throw away plants if you see mold on them.  Clean your floors and dust regularly. Use unscented cleaning products.  Try to have someone else vacuum for you regularly. Stay out of rooms while they are  being vacuumed and for a short while afterward. If you vacuum, use a dust mask from a hardware store, a double-layered or microfilter vacuum cleaner bag, or a vacuum cleaner with a HEPA filter.  Replace carpet with wood, tile, or vinyl flooring. Carpet can trap dander and dust.  Use allergy-proof pillows, mattress covers, and box spring covers.  Wash bed sheets and blankets every week in hot water and dry them in a dryer.  Use blankets that are made of polyester or cotton.  Clean bathrooms and kitchens with bleach. If possible, have someone repaint the walls in these rooms with mold-resistant paint. Keep out of the rooms that are being cleaned and painted.  Wash hands frequently. SEEK MEDICAL CARE IF:   You have wheezing, shortness of breath, or a cough even if taking medicine to prevent attacks.  The colored mucus you cough up (sputum) is thicker than usual.  Your sputum changes from clear or white to yellow, green, gray, or bloody.  You have any problems that may be related to the medicines you are taking (such as a rash, itching, swelling, or trouble breathing).  You are using a reliever medicine more than 2-3 times per week.  Your peak flow is still at 50-79% of your personal best after following your action plan for 1 hour.  You have a fever. SEEK IMMEDIATE MEDICAL CARE IF:   You seem to be getting worse and are unresponsive to treatment during an asthma attack.  You are short of breath even at rest.  You get short of breath when doing very little physical activity.  You have difficulty eating, drinking, or talking due to asthma symptoms.  You develop chest pain.  You develop a fast heartbeat.  You have a bluish color to your lips or fingernails.  You are light-headed, dizzy, or faint.  Your peak flow is less than 50% of your personal best.   This information is not intended to replace advice given to you by your health care provider. Make sure you discuss any  questions you have with your health care provider.   Document Released: 05/18/2005 Document Revised: 02/06/2015 Document Reviewed: 12/15/2012 Elsevier Interactive Patient Education 2016 Elsevier Inc.  

## 2015-05-27 NOTE — ED Notes (Signed)
Pt out of albuterol inhaler for a few hours.  Requesting a rx,  Treated at [facility].  No resp distress now.

## 2015-05-27 NOTE — ED Provider Notes (Signed)
[facility] Emergency Department Provider Note  ____________________________________________  Time seen: Approximately 10:17 PM  I have reviewed the triage vital signs and the nursing notes.   HISTORY  Chief Complaint Medication Refill   HPI Justin [name] is a 19 y.o. male who presents to the emergency department for refill of his albuterol inhaler. He states he ran out tonight and is concerned that he can't wait until he schedules an appointment with PCP. He denies current illness or wheezing.   Past Medical History  Diagnosis Date  . Asthma     There are no active problems to display for this patient.   History reviewed. No pertinent past surgical history.  Current Outpatient Rx  Name  Route  Sig  Dispense  Refill  . albuterol (PROVENTIL HFA;VENTOLIN HFA) 108 (90 BASE) MCG/ACT inhaler   Inhalation   Inhale 2 puffs into the lungs every 6 (six) hours as needed for wheezing or shortness of breath.   1 Inhaler   2   . cetirizine (ZYRTEC) 5 MG tablet   Oral   Take 1 tablet (5 mg total) by mouth daily.   30 tablet   0   . ondansetron (ZOFRAN ODT) 4 MG disintegrating tablet   Oral   Take 1 tablet (4 mg total) by mouth every 8 (eight) hours as needed for nausea or vomiting.   20 tablet   0   . phenazopyridine (PYRIDIUM) 200 MG tablet   Oral   Take 1 tablet (200 mg total) by mouth 3 (three) times daily as needed for pain.   6 tablet   0   . predniSONE (DELTASONE) 50 MG tablet   Oral   Take 1 tablet (50 mg total) by mouth daily with breakfast.   5 tablet   0   . sulfamethoxazole-trimethoprim (BACTRIM DS,SEPTRA DS) 800-160 MG tablet   Oral   Take 1 tablet by mouth 2 (two) times daily.   6 tablet   0   . triamcinolone (NASACORT AQ) 55 MCG/ACT AERO nasal inhaler   Nasal   Place 1 spray into the nose 2 (two) times daily.   1 Inhaler   1     Allergies Review of patient's allergies indicates no known allergies.  No family  history on file.  Social History Social History  Substance Use Topics  . Smoking status: Current Every Day Smoker -- 0.50 packs/day    Types: Cigarettes  . Smokeless tobacco: None  . Alcohol Use: No    Review of Systems Constitutional: No fever/chills Eyes: No visual changes. ENT: No sore throat. Cardiovascular: Denies chest pain. Respiratory: Denies shortness of breath. Gastrointestinal: No abdominal pain.  No nausea, no vomiting.  No diarrhea.  No constipation. Genitourinary: Negative for dysuria. Musculoskeletal: Negative for back pain. Skin: Negative for rash. Neurological: Negative for headaches, focal weakness or numbness.  10-point ROS otherwise negative.  ____________________________________________   PHYSICAL EXAM:  VITAL SIGNS: ED Triage Vitals  Enc Vitals Group     BP 05/27/15 1957 152/100 mmHg     Pulse Rate 05/27/15 1957 71     Resp 05/27/15 1957 18     Temp --      Temp src --      SpO2 05/27/15 1957 98 %     Weight 05/27/15 1957 250 lb (113.399 kg)     Height 05/27/15 1957 6\' 1"  (1.854 m)     Head Cir --      Peak Flow --  Pain Score 05/27/15 1958 0     Pain Loc --      Pain Edu? --      Excl. in GC? --     Constitutional: Alert and oriented. Well appearing and in no acute distress. Eyes: Conjunctivae are normal. PERRL. EOMI. Head: Atraumatic. Nose: No congestion/rhinnorhea. Mouth/Throat: Mucous membranes are moist.  Oropharynx non-erythematous. Neck: No stridor.   Cardiovascular: Normal rate, regular rhythm. Grossly normal heart sounds.  Good peripheral circulation. Respiratory: Normal respiratory effort.  No retractions. Lungs CTAB. Gastrointestinal: Soft and nontender. No distention. No abdominal bruits. No CVA tenderness. Musculoskeletal: No lower extremity tenderness nor edema.  No joint effusions. Neurologic:  Normal speech and language. No gross focal neurologic deficits are appreciated. No gait instability. Skin:  Skin is warm,  dry and intact. No rash noted. Psychiatric: Mood and affect are normal. Speech and behavior are normal.  ____________________________________________   LABS (all labs ordered are listed, but only abnormal results are displayed)  Labs Reviewed - No data to display ____________________________________________  EKG   ____________________________________________  RADIOLOGY   ____________________________________________   PROCEDURES  Procedure(s) performed:   Critical Care performed:   ____________________________________________   INITIAL IMPRESSION / ASSESSMENT AND PLAN / ED COURSE  Pertinent labs & imaging results that were available during my care of the patient were reviewed by me and considered in my medical decision making (see chart for details).  Patient was strongly encouraged to follow up with [facility]. He is here often for refills of albuterol. He was advised that he is having to use the albuterol too often and will need to be assessed to develop an asthma plan.  ____________________________________________   FINAL CLINICAL IMPRESSION(S) / ED DIAGNOSES  Final diagnoses:  Asthma, mild persistent, uncomplicated  Medication refill      Chinita Pester, FNP 05/27/15 2222  Darien Ramus, MD 05/27/15 [phone]

## 2015-06-14 ENCOUNTER — Emergency Department
Admission: EM | Admit: 2015-06-14 | Discharge: 2015-06-14 | Disposition: A | Discharge status: Home / Self Care | Payer: Medicaid Other | Attending: Emergency Medicine | Admitting: Emergency Medicine

## 2015-06-14 DIAGNOSIS — Z792 Long term (current) use of antibiotics: Secondary | ICD-10-CM | POA: Diagnosis not present

## 2015-06-14 DIAGNOSIS — F17210 Nicotine dependence, cigarettes, uncomplicated: Secondary | ICD-10-CM | POA: Insufficient documentation

## 2015-06-14 DIAGNOSIS — Z79899 Other long term (current) drug therapy: Secondary | ICD-10-CM | POA: Diagnosis not present

## 2015-06-14 DIAGNOSIS — Z7951 Long term (current) use of inhaled steroids: Secondary | ICD-10-CM | POA: Insufficient documentation

## 2015-06-14 DIAGNOSIS — J4521 Mild intermittent asthma with (acute) exacerbation: Secondary | ICD-10-CM

## 2015-06-14 DIAGNOSIS — Z760 Encounter for issue of repeat prescription: Secondary | ICD-10-CM | POA: Diagnosis present

## 2015-06-14 MED ORDER — PREDNISONE 20 MG PO TABS
60.0000 mg | ORAL_TABLET | Freq: Once | ORAL | Status: AC
  Administered 2015-06-14: 60 mg via ORAL
  Filled 2015-06-14: qty 3

## 2015-06-14 MED ORDER — ALBUTEROL SULFATE HFA 108 (90 BASE) MCG/ACT IN AERS: 2.0000 | INHALATION_SPRAY | RESPIRATORY_TRACT | Status: DC | PRN

## 2015-06-14 MED ORDER — PREDNISONE 20 MG PO TABS: ORAL_TABLET | ORAL | Status: DC

## 2015-06-14 MED ORDER — IPRATROPIUM-ALBUTEROL 0.5-2.5 (3) MG/3ML IN SOLN
3.0000 mL | Freq: Once | RESPIRATORY_TRACT | Status: AC
  Administered 2015-06-14: 3 mL via RESPIRATORY_TRACT
  Filled 2015-06-14: qty 3

## 2015-06-14 NOTE — ED Provider Notes (Signed)
[facility] Emergency Department Provider Note  ____________________________________________  Time seen: Approximately 2:40 AM  I have reviewed the triage vital signs and the nursing notes.   HISTORY  Chief Complaint Medication Refill    HPI Justin [name] is a 20 y.o. male who presents to the ED from work with a chief complaint of medication refill.Patient has mild intermittent asthma, and is experiencing an exacerbation. Also states he is out of his albuterol inhaler. Requesting breathing treatment. Complains of nonproductive cough. Denies associated fever, chills, chest pain, shortness breath, abdominal pain, nausea, vomiting, diarrhea. Denies recent travel or trauma. Nothing makes his symptoms better or worse.   Past Medical History  Diagnosis Date  . Asthma     There are no active problems to display for this patient.   History reviewed. No pertinent past surgical history.  Current Outpatient Rx  Name  Route  Sig  Dispense  Refill  . albuterol (PROVENTIL HFA;VENTOLIN HFA) 108 (90 BASE) MCG/ACT inhaler   Inhalation   Inhale 2 puffs into the lungs every 6 (six) hours as needed for wheezing or shortness of breath.   1 Inhaler   2   . cetirizine (ZYRTEC) 5 MG tablet   Oral   Take 1 tablet (5 mg total) by mouth daily.   30 tablet   0   . ondansetron (ZOFRAN ODT) 4 MG disintegrating tablet   Oral   Take 1 tablet (4 mg total) by mouth every 8 (eight) hours as needed for nausea or vomiting.   20 tablet   0   . phenazopyridine (PYRIDIUM) 200 MG tablet   Oral   Take 1 tablet (200 mg total) by mouth 3 (three) times daily as needed for pain.   6 tablet   0   . predniSONE (DELTASONE) 50 MG tablet   Oral   Take 1 tablet (50 mg total) by mouth daily with breakfast.   5 tablet   0   . sulfamethoxazole-trimethoprim (BACTRIM DS,SEPTRA DS) 800-160 MG tablet   Oral   Take 1 tablet by mouth 2 (two) times daily.   6 tablet   0   .  triamcinolone (NASACORT AQ) 55 MCG/ACT AERO nasal inhaler   Nasal   Place 1 spray into the nose 2 (two) times daily.   1 Inhaler   1     Allergies Review of patient's allergies indicates no known allergies.  No family history on file.  Social History Social History  Substance Use Topics  . Smoking status: Current Every Day Smoker -- 0.50 packs/day    Types: Cigarettes  . Smokeless tobacco: None  . Alcohol Use: No    Review of Systems Constitutional: No fever/chills Eyes: No visual changes. ENT: No sore throat. Cardiovascular: Denies chest pain. Respiratory: Positive for wheezing. Denies shortness of breath. Gastrointestinal: No abdominal pain.  No nausea, no vomiting.  No diarrhea.  No constipation. Genitourinary: Negative for dysuria. Musculoskeletal: Negative for back pain. Skin: Negative for rash. Neurological: Negative for headaches, focal weakness or numbness.  10-point ROS otherwise negative.  ____________________________________________   PHYSICAL EXAM:  VITAL SIGNS: ED Triage Vitals  Enc Vitals Group     BP 06/14/15 0228 155/92 mmHg     Pulse Rate 06/14/15 0228 76     Resp 06/14/15 0228 18     Temp 06/14/15 0228 97.9 F (36.6 C)     Temp Source 06/14/15 0228 Oral     SpO2 06/14/15 0228 96 %  Weight 06/14/15 0228 250 lb (113.399 kg)     Height 06/14/15 0228 6\' 1"  (1.854 m)     Head Cir --      Peak Flow --      Pain Score --      Pain Loc --      Pain Edu? --      Excl. in GC? --     Constitutional: Alert and oriented. Well appearing and in no acute distress. Eyes: Conjunctivae are normal. PERRL. EOMI. Head: Atraumatic. Nose: No congestion/rhinnorhea. Mouth/Throat: Mucous membranes are moist.  Oropharynx non-erythematous. Neck: No stridor.   Cardiovascular: Normal rate, regular rhythm. Grossly normal heart sounds.  Good peripheral circulation. Respiratory: Normal respiratory effort.  No retractions. Lungs with mild diffuse  wheezing. Gastrointestinal: Soft and nontender. No distention. No abdominal bruits. No CVA tenderness. Musculoskeletal: No lower extremity tenderness nor edema.  No joint effusions. Neurologic:  Normal speech and language. No gross focal neurologic deficits are appreciated. No gait instability. Skin:  Skin is warm, dry and intact. No rash noted. Psychiatric: Mood and affect are normal. Speech and behavior are normal.  ____________________________________________   LABS (all labs ordered are listed, but only abnormal results are displayed)  Labs Reviewed - No data to display ____________________________________________  EKG  None ____________________________________________  RADIOLOGY  None ____________________________________________   PROCEDURES  Procedure(s) performed: None  Critical Care performed: No  ____________________________________________   INITIAL IMPRESSION / ASSESSMENT AND PLAN / ED COURSE  Pertinent labs & imaging results that were available during my care of the patient were reviewed by me and considered in my medical decision making (see chart for details).  20 year old male who presents for medication refill and mild asthma exacerbation. Will initiate DuoNeb and prednisone.  ----------------------------------------- 3:48 AM on 06/14/2015 -----------------------------------------  Reassessed after nebulizer treatment. Lungs clear without wheezing. Room air saturations 97%. Patient will continue on steroid burst for total 5 days, prescription for albuterol inhaler and follow-up with PCP. Strict return precautions given. Patient verbalizes understanding and agrees with plan of care. ____________________________________________   FINAL CLINICAL IMPRESSION(S) / ED DIAGNOSES  Final diagnoses:  Asthma, mild intermittent, with acute exacerbation      Irean Hong, MD 06/14/15 [phone]

## 2015-06-14 NOTE — ED Notes (Signed)
Reviewed d/c instructions, possible trigger sources of asthma exacerbations, medications and follow-up care with pt. Pt verbalized understanding.

## 2015-06-14 NOTE — Discharge Instructions (Signed)
1. Take steroid as prescribed (prednisone 60 mg daily 4 days). 2. Use albuterol inhaler 2 puffs every 4 hours as needed for wheezing. 3. Return to the ER for worsening symptoms, persistent vomiting, difficulty breathing or other concerns.  Asthma, Adult Asthma is a recurring condition in which the airways tighten and narrow. Asthma can make it difficult to breathe. It can cause coughing, wheezing, and shortness of breath. Asthma episodes, also called asthma attacks, range from minor to life-threatening. Asthma cannot be cured, but medicines and lifestyle changes can help control it. CAUSES Asthma is believed to be caused by inherited (genetic) and environmental factors, but its exact cause is unknown. Asthma may be triggered by allergens, lung infections, or irritants in the air. Asthma triggers are different for each person. Common triggers include:   Animal dander.  Dust mites.  Cockroaches.  Pollen from trees or grass.  Mold.  Smoke.  Air pollutants such as dust, household cleaners, hair sprays, aerosol sprays, paint fumes, strong chemicals, or strong odors.  Cold air, weather changes, and winds (which increase molds and pollens in the air).  Strong emotional expressions such as crying or laughing hard.  Stress.  Certain medicines (such as aspirin) or types of drugs (such as beta-blockers).  Sulfites in foods and drinks. Foods and drinks that may contain sulfites include dried fruit, potato chips, and sparkling grape juice.  Infections or inflammatory conditions such as the flu, a cold, or an inflammation of the nasal membranes (rhinitis).  Gastroesophageal reflux disease (GERD).  Exercise or strenuous activity. SYMPTOMS Symptoms may occur immediately after asthma is triggered or many hours later. Symptoms include:  Wheezing.  Excessive nighttime or early morning coughing.  Frequent or severe coughing with a common cold.  Chest tightness.  Shortness of  breath. DIAGNOSIS  The diagnosis of asthma is made by a review of your medical history and a physical exam. Tests may also be performed. These may include:  Lung function studies. These tests show how much air you breathe in and out.  Allergy tests.  Imaging tests such as X-rays. TREATMENT  Asthma cannot be cured, but it can usually be controlled. Treatment involves identifying and avoiding your asthma triggers. It also involves medicines. There are 2 classes of medicine used for asthma treatment:   Controller medicines. These prevent asthma symptoms from occurring. They are usually taken every day.  Reliever or rescue medicines. These quickly relieve asthma symptoms. They are used as needed and provide short-term relief. Your health care provider will help you create an asthma action plan. An asthma action plan is a written plan for managing and treating your asthma attacks. It includes a list of your asthma triggers and how they may be avoided. It also includes information on when medicines should be taken and when their dosage should be changed. An action plan may also involve the use of a device called a peak flow meter. A peak flow meter measures how well the lungs are working. It helps you monitor your condition. HOME CARE INSTRUCTIONS   Take medicines only as directed by your health care provider. Speak with your health care provider if you have questions about how or when to take the medicines.  Use a peak flow meter as directed by your health care provider. Record and keep track of readings.  Understand and use the action plan to help minimize or stop an asthma attack without needing to seek medical care.  Control your home environment in the following ways to  help prevent asthma attacks:  Do not smoke. Avoid being exposed to secondhand smoke.  Change your heating and air conditioning filter regularly.  Limit your use of fireplaces and wood stoves.  Get rid of pests (such as  roaches and mice) and their droppings.  Throw away plants if you see mold on them.  Clean your floors and dust regularly. Use unscented cleaning products.  Try to have someone else vacuum for you regularly. Stay out of rooms while they are being vacuumed and for a short while afterward. If you vacuum, use a dust mask from a hardware store, a double-layered or microfilter vacuum cleaner bag, or a vacuum cleaner with a HEPA filter.  Replace carpet with wood, tile, or vinyl flooring. Carpet can trap dander and dust.  Use allergy-proof pillows, mattress covers, and box spring covers.  Wash bed sheets and blankets every week in hot water and dry them in a dryer.  Use blankets that are made of polyester or cotton.  Clean bathrooms and kitchens with bleach. If possible, have someone repaint the walls in these rooms with mold-resistant paint. Keep out of the rooms that are being cleaned and painted.  Wash hands frequently. SEEK MEDICAL CARE IF:   You have wheezing, shortness of breath, or a cough even if taking medicine to prevent attacks.  The colored mucus you cough up (sputum) is thicker than usual.  Your sputum changes from clear or white to yellow, green, gray, or bloody.  You have any problems that may be related to the medicines you are taking (such as a rash, itching, swelling, or trouble breathing).  You are using a reliever medicine more than 2-3 times per week.  Your peak flow is still at 50-79% of your personal best after following your action plan for 1 hour.  You have a fever. SEEK IMMEDIATE MEDICAL CARE IF:   You seem to be getting worse and are unresponsive to treatment during an asthma attack.  You are short of breath even at rest.  You get short of breath when doing very little physical activity.  You have difficulty eating, drinking, or talking due to asthma symptoms.  You develop chest pain.  You develop a fast heartbeat.  You have a bluish color to your  lips or fingernails.  You are light-headed, dizzy, or faint.  Your peak flow is less than 50% of your personal best.   This information is not intended to replace advice given to you by your health care provider. Make sure you discuss any questions you have with your health care provider.   Document Released: 05/18/2005 Document Revised: 02/06/2015 Document Reviewed: 12/15/2012 Elsevier Interactive Patient Education [employer] Inc.

## 2015-06-14 NOTE — ED Notes (Signed)
Pt denies any difficulty breathing and is here for a breathing treatment and medication refill only.  Changed CC to "Medication Refill".

## 2015-06-29 ENCOUNTER — Emergency Department
Admission: EM | Admit: 2015-06-29 | Discharge: 2015-06-29 | Disposition: A | Discharge status: Home / Self Care | Payer: Medicaid Other | Attending: Emergency Medicine | Admitting: Emergency Medicine

## 2015-06-29 DIAGNOSIS — Z7952 Long term (current) use of systemic steroids: Secondary | ICD-10-CM | POA: Diagnosis not present

## 2015-06-29 DIAGNOSIS — F17210 Nicotine dependence, cigarettes, uncomplicated: Secondary | ICD-10-CM | POA: Diagnosis not present

## 2015-06-29 DIAGNOSIS — J45901 Unspecified asthma with (acute) exacerbation: Secondary | ICD-10-CM | POA: Diagnosis not present

## 2015-06-29 DIAGNOSIS — J45909 Unspecified asthma, uncomplicated: Secondary | ICD-10-CM

## 2015-06-29 DIAGNOSIS — Z760 Encounter for issue of repeat prescription: Secondary | ICD-10-CM | POA: Diagnosis present

## 2015-06-29 DIAGNOSIS — Z79899 Other long term (current) drug therapy: Secondary | ICD-10-CM | POA: Insufficient documentation

## 2015-06-29 MED ORDER — ALBUTEROL SULFATE HFA 108 (90 BASE) MCG/ACT IN AERS
INHALATION_SPRAY | RESPIRATORY_TRACT | Status: DC
Start: 2015-06-29 — End: 2015-07-12

## 2015-06-29 NOTE — ED Notes (Addendum)
Pt states he is here for a prescription refill, ProAir. Pt states he has been out of his prescription for 2 days, states he comes to the ED normally to get this prescription. Pt laying in bed on his phone, no distress noted at this time.

## 2015-06-29 NOTE — Discharge Instructions (Signed)
As we discussed, you need to go to your primary care doctor for your medication refills.  We are refilling your medication this time because we do not want to to get more acutely ill, but this is a chronic medication management issue and your primary care doctor can help you.   Asthma, Adult Asthma is a condition of the lungs in which the airways tighten and narrow. Asthma can make it hard to breathe. Asthma cannot be cured, but medicine and lifestyle changes can help control it. Asthma may be started (triggered) by: 1. Animal skin flakes (dander). 2. Dust. 3. Cockroaches. 4. Pollen. 5. Mold. 6. Smoke. 7. Cleaning products. 8. Hair sprays or aerosol sprays. 9. Paint fumes or strong smells. 10. Cold air, weather changes, and winds. 11. Crying or laughing hard. 12. Stress. 13. Certain medicines or drugs. 14. Foods, such as dried fruit, potato chips, and sparkling grape juice. 15. Infections or conditions (colds, flu). 16. Exercise. 17. Certain medical conditions or diseases. 18. Exercise or tiring activities. HOME CARE  1. Take medicine as told by your doctor. 2. Use a peak flow meter as told by your doctor. A peak flow meter is a tool that measures how well the lungs are working. 3. Record and keep track of the peak flow meter's readings. 4. Understand and use the asthma action plan. An asthma action plan is a written plan for taking care of your asthma and treating your attacks. 5. To help prevent asthma attacks: 1. Do not smoke. Stay away from secondhand smoke. 2. Change your heating and air conditioning filter often. 3. Limit your use of fireplaces and wood stoves. 4. Get rid of pests (such as roaches and mice) and their droppings. 5. Throw away plants if you see mold on them. 6. Clean your floors. Dust regularly. Use cleaning products that do not smell. 7. Have someone vacuum when you are not home. Use a vacuum cleaner with a HEPA filter if possible. 8. Replace carpet with  wood, tile, or vinyl flooring. Carpet can trap animal skin flakes and dust. 9. Use allergy-proof pillows, mattress covers, and box spring covers. 10. Wash bed sheets and blankets every week in hot water and dry them in a dryer. 11. Use blankets that are made of polyester or cotton. 12. Clean bathrooms and kitchens with bleach. If possible, have someone repaint the walls in these rooms with mold-resistant paint. Keep out of the rooms that are being cleaned and painted. 13. Wash hands often. GET HELP IF:  You have make a whistling sound when breaking (wheeze), have shortness of breath, or have a cough even if taking medicine to prevent attacks.  The colored mucus you cough up (sputum) is thicker than usual.  The colored mucus you cough up changes from clear or white to yellow, green, gray, or bloody.  You have problems from the medicine you are taking such as:  A rash.  Itching.  Swelling.  Trouble breathing.  You need reliever medicines more than 2-3 times a week.  Your peak flow measurement is still at 50-79% of your personal best after following the action plan for 1 hour.  You have a fever. GET HELP RIGHT AWAY IF:   You seem to be worse and are not responding to medicine during an asthma attack.  You are short of breath even at rest.  You get short of breath when doing very little activity.  You have trouble eating, drinking, or talking.  You have chest pain.  You have a fast heartbeat.  Your lips or fingernails start to turn blue.  You are light-headed, dizzy, or faint.  Your peak flow is less than 50% of your personal best.   This information is not intended to replace advice given to you by your health care provider. Make sure you discuss any questions you have with your health care provider.   Document Released: 11/04/2007 Document Revised: 02/06/2015 Document Reviewed: 12/15/2012 Elsevier Interactive Patient Education 2016 [employer].  How to Use an  Inhaler Proper inhaler technique is very important. Good technique ensures that the medicine reaches the lungs. Poor technique results in depositing the medicine on the tongue and back of the throat rather than in the airways. If you do not use the inhaler with good technique, the medicine will not help you. STEPS TO FOLLOW IF USING AN INHALER WITHOUT AN EXTENSION TUBE 19. Remove the cap from the inhaler. 20. If you are using the inhaler for the first time, you will need to prime it. Shake the inhaler for 5 seconds and release four puffs into the air, away from your face. Ask your health care provider or pharmacist if you have questions about priming your inhaler. 21. Shake the inhaler for 5 seconds before each breath in (inhalation). 22. Position the inhaler so that the top of the canister faces up. 23. Put your index finger on the top of the medicine canister. Your thumb supports the bottom of the inhaler. 24. Open your mouth. 25. Either place the inhaler between your teeth and place your lips tightly around the mouthpiece, or hold the inhaler 1-2 inches away from your open mouth. If you are unsure of which technique to use, ask your health care provider. 26. Breathe out (exhale) normally and as completely as possible. 27. Press the canister down with your index finger to release the medicine. 28. At the same time as the canister is pressed, inhale deeply and slowly until your lungs are completely filled. This should take 4-6 seconds. Keep your tongue down. 29. Hold the medicine in your lungs for 5-10 seconds (10 seconds is best). This helps the medicine get into the small airways of your lungs. 30. Breathe out slowly, through pursed lips. Whistling is an example of pursed lips. 31. Wait at least 15-30 seconds between puffs. Continue with the above steps until you have taken the number of puffs your health care provider has ordered. Do not use the inhaler more than your health care provider tells  you. 32. Replace the cap on the inhaler. 33. Follow the directions from your health care provider or the inhaler insert for cleaning the inhaler. STEPS TO FOLLOW IF USING AN INHALER WITH AN EXTENSION (SPACER) 6. Remove the cap from the inhaler. 7. If you are using the inhaler for the first time, you will need to prime it. Shake the inhaler for 5 seconds and release four puffs into the air, away from your face. Ask your health care provider or pharmacist if you have questions about priming your inhaler. 8. Shake the inhaler for 5 seconds before each breath in (inhalation). 9. Place the open end of the spacer onto the mouthpiece of the inhaler. 10. Position the inhaler so that the top of the canister faces up and the spacer mouthpiece faces you. 11. Put your index finger on the top of the medicine canister. Your thumb supports the bottom of the inhaler and the spacer. 12. Breathe out (exhale) normally and as completely as possible. 13.  Immediately after exhaling, place the spacer between your teeth and into your mouth. Close your lips tightly around the spacer. 14. Press the canister down with your index finger to release the medicine. 15. At the same time as the canister is pressed, inhale deeply and slowly until your lungs are completely filled. This should take 4-6 seconds. Keep your tongue down and out of the way. 16. Hold the medicine in your lungs for 5-10 seconds (10 seconds is best). This helps the medicine get into the small airways of your lungs. Exhale. 17. Repeat inhaling deeply through the spacer mouthpiece. Again hold that breath for up to 10 seconds (10 seconds is best). Exhale slowly. If it is difficult to take this second deep breath through the spacer, breathe normally several times through the spacer. Remove the spacer from your mouth. 18. Wait at least 15-30 seconds between puffs. Continue with the above steps until you have taken the number of puffs your health care provider has  ordered. Do not use the inhaler more than your health care provider tells you. 19. Remove the spacer from the inhaler, and place the cap on the inhaler. 20. Follow the directions from your health care provider or the inhaler insert for cleaning the inhaler and spacer. If you are using different kinds of inhalers, use your quick relief medicine to open the airways 10-15 minutes before using a steroid if instructed to do so by your health care provider. If you are unsure which inhalers to use and the order of using them, ask your health care provider, nurse, or respiratory therapist. If you are using a steroid inhaler, always rinse your mouth with water after your last puff, then gargle and spit out the water. Do not swallow the water. AVOID:  Inhaling before or after starting the spray of medicine. It takes practice to coordinate your breathing with triggering the spray.  Inhaling through the nose (rather than the mouth) when triggering the spray. HOW TO DETERMINE IF YOUR INHALER IS FULL OR NEARLY EMPTY You cannot know when an inhaler is empty by shaking it. A few inhalers are now being made with dose counters. Ask your health care provider for a prescription that has a dose counter if you feel you need that extra help. If your inhaler does not have a counter, ask your health care provider to help you determine the date you need to refill your inhaler. Write the refill date on a calendar or your inhaler canister. Refill your inhaler 7-10 days before it runs out. Be sure to keep an adequate supply of medicine. This includes making sure it is not expired, and that you have a spare inhaler.  SEEK MEDICAL CARE IF:   Your symptoms are only partially relieved with your inhaler.  You are having trouble using your inhaler.  You have some increase in phlegm. SEEK IMMEDIATE MEDICAL CARE IF:   You feel little or no relief with your inhalers. You are still wheezing and are feeling shortness of breath or  tightness in your chest or both.  You have dizziness, headaches, or a fast heart rate.  You have chills, fever, or night sweats.  You have a noticeable increase in phlegm production, or there is blood in the phlegm. MAKE SURE YOU:   Understand these instructions.  Will watch your condition.  Will get help right away if you are not doing well or get worse.   This information is not intended to replace advice given to you by  your health care provider. Make sure you discuss any questions you have with your health care provider.   Document Released: 05/15/2000 Document Revised: 03/08/2013 Document Reviewed: 12/15/2012 Elsevier Interactive Patient Education [employer]

## 2015-06-29 NOTE — ED Notes (Addendum)
Pt to triage ambulatory with steady gait. Pt states he is here for a refill of his ProAir. Pt states he has been out for several days. Pt is noted to be whezing at this time. Pt states he just got off work and thought he would come to get his medication refilled. Pt talking in full and complete sentences with no difficulty.

## 2015-06-29 NOTE — ED Notes (Signed)
Dr. Forbach at bedside.  

## 2015-06-29 NOTE — ED Provider Notes (Signed)
[facility] Emergency Department Provider Note  ____________________________________________  Time seen: Approximately 6:28 AM  I have reviewed the triage vital signs and the nursing notes.   HISTORY  Chief Complaint Medication Refill    HPI Justin [name] is a 20 y.o. male with a history of asthma and tobacco abuse who presents tonight requesting a medication refill of his albuterol inhaler.  He states that he does have a primary care doctor but it is not in a convenient location so he prefers to come to the emergency department for medication refill.  He is not having an acute exacerbation at this time.  He has a little bit of wheezing which is normal for him.  He denies chest pain, shortness of breath, nausea, vomiting, abdominal pain, headache.  He does not have any symptoms other than the mild wheezing.  Exercise makes the recent little bit worse and resting makes it better.  It has been a gradual onset/chronic condition.   Past Medical History  Diagnosis Date  . Asthma     There are no active problems to display for this patient.   No past surgical history on file.  Current Outpatient Rx  Name  Route  Sig  Dispense  Refill  . albuterol (PROVENTIL HFA;VENTOLIN HFA) 108 (90 Base) MCG/ACT inhaler      Inhale 2-4 puffs by mouth every 4 hours as needed for wheezing, cough, and/or shortness of breath   1 Inhaler   1   . cetirizine (ZYRTEC) 5 MG tablet   Oral   Take 1 tablet (5 mg total) by mouth daily.   30 tablet   0   . ondansetron (ZOFRAN ODT) 4 MG disintegrating tablet   Oral   Take 1 tablet (4 mg total) by mouth every 8 (eight) hours as needed for nausea or vomiting.   20 tablet   0   . phenazopyridine (PYRIDIUM) 200 MG tablet   Oral   Take 1 tablet (200 mg total) by mouth 3 (three) times daily as needed for pain.   6 tablet   0   . predniSONE (DELTASONE) 20 MG tablet      3 tablets daily 4 days   12 tablet   0   .  sulfamethoxazole-trimethoprim (BACTRIM DS,SEPTRA DS) 800-160 MG tablet   Oral   Take 1 tablet by mouth 2 (two) times daily.   6 tablet   0   . triamcinolone (NASACORT AQ) 55 MCG/ACT AERO nasal inhaler   Nasal   Place 1 spray into the nose 2 (two) times daily.   1 Inhaler   1     Allergies Review of patient's allergies indicates no known allergies.  No family history on file.  Social History Social History  Substance Use Topics  . Smoking status: Current Every Day Smoker -- 0.50 packs/day    Types: Cigarettes  . Smokeless tobacco: Not on file  . Alcohol Use: No    Review of Systems Constitutional: No fever/chills ENT: No sore throat. Cardiovascular: Denies chest pain. Respiratory: Denies shortness of breath. Gastrointestinal: No abdominal pain.  No nausea, no vomiting.  No diarrhea.  No constipation. Neurological: Negative for headaches, focal weakness or numbness.   ____________________________________________   PHYSICAL EXAM:  VITAL SIGNS: ED Triage Vitals  Enc Vitals Group     BP 06/29/15 0542 162/102 mmHg     Pulse Rate 06/29/15 0542 64     Resp 06/29/15 0542 18     Temp 06/29/15  0542 98.2 F (36.8 C)     Temp Source 06/29/15 0542 Oral     SpO2 06/29/15 0542 96 %     Weight 06/29/15 0542 250 lb (113.399 kg)     Height 06/29/15 0542  (1.854 m)     Head Cir --      Peak Flow --      Pain Score 06/29/15 0543 0     Pain Loc --      Pain Edu? --      Excl. in GC? --     Constitutional: Alert and oriented. Well appearing and in no acute distress. Eyes: Conjunctivae are normal. PERRL. EOMI. Nose: No congestion/rhinnorhea. Cardiovascular: Normal rate, regular rhythm. Grossly normal heart sounds.  Good peripheral circulation. Respiratory: Normal respiratory effort.  No retractions.  Mild expiratory wheezing throughout.  No increased work of breathing Gastrointestinal: Soft and nontender. No distention. No abdominal bruits. No CVA  tenderness. Musculoskeletal: No lower extremity tenderness nor edema.  No joint effusions. Neurologic:  Normal speech and language. No gross focal neurologic deficits are appreciated.  Psychiatric: Mood and affect are normal. Speech and behavior are normal.  ____________________________________________   LABS (all labs ordered are listed, but only abnormal results are displayed)  Labs Reviewed - No data to display ____________________________________________  EKG  None ____________________________________________  RADIOLOGY   No results found.  ____________________________________________   PROCEDURES  Procedure(s) performed: None  Critical Care performed: No ____________________________________________   INITIAL IMPRESSION / ASSESSMENT AND PLAN / ED COURSE  Pertinent labs & imaging results that were available during my care of the patient were reviewed by me and considered in my medical decision making (see chart for details).  The patient does not have an acute asthma exacerbation.  I gave him strict instructions that he needs to follow up with his primary care doctor and not in the emergency department.  I do not want him to get worse and have a full-blown exacerbation, which he has had in the past, so I will refill his inhaler this time.  I also instructed him that it is not at all wise of him to continue smoking with his asthma history and that he needs to try to stop smoking.  He understands my recommendations.  ____________________________________________  FINAL CLINICAL IMPRESSION(S) / ED DIAGNOSES  Final diagnoses:  Chronic asthma without complication      NEW MEDICATIONS STARTED DURING THIS VISIT:  New Prescriptions   ALBUTEROL (PROVENTIL HFA;VENTOLIN HFA) 108 (90 BASE) MCG/ACT INHALER    Inhale 2-4 puffs by mouth every 4 hours as needed for wheezing, cough, and/or shortness of breath     Loleta Rose, MD 06/29/15 [phone]

## 2015-07-12 ENCOUNTER — Emergency Department
Admission: EM | Admit: 2015-07-12 | Discharge: 2015-07-12 | Disposition: A | Discharge status: Home / Self Care | Payer: Medicaid Other | Attending: Emergency Medicine | Admitting: Emergency Medicine

## 2015-07-12 ENCOUNTER — Encounter: Payer: Self-pay | Admitting: Emergency Medicine

## 2015-07-12 DIAGNOSIS — F17210 Nicotine dependence, cigarettes, uncomplicated: Secondary | ICD-10-CM | POA: Insufficient documentation

## 2015-07-12 DIAGNOSIS — Z79899 Other long term (current) drug therapy: Secondary | ICD-10-CM | POA: Insufficient documentation

## 2015-07-12 DIAGNOSIS — Z792 Long term (current) use of antibiotics: Secondary | ICD-10-CM | POA: Diagnosis not present

## 2015-07-12 DIAGNOSIS — Z760 Encounter for issue of repeat prescription: Secondary | ICD-10-CM

## 2015-07-12 DIAGNOSIS — Z7951 Long term (current) use of inhaled steroids: Secondary | ICD-10-CM | POA: Insufficient documentation

## 2015-07-12 MED ORDER — ALBUTEROL SULFATE HFA 108 (90 BASE) MCG/ACT IN AERS: 2.0000 | INHALATION_SPRAY | RESPIRATORY_TRACT | Status: DC | PRN

## 2015-07-12 NOTE — Discharge Instructions (Signed)
Medicine Refill at the Emergency Department  We have refilled your medicine today, but it is best for you to get refills through your primary health care provider's office. In the future, please plan ahead so you do not need to get refills from the emergency department.  If the medicine we refilled was a maintenance medicine, you may have received only enough to get you by until you are able to see your regular health care provider.     This information is not intended to replace advice given to you by your health care provider. Make sure you discuss any questions you have with your health care provider.     Document Released: 09/04/2003 Document Revised: 06/08/2014 Document Reviewed: 08/25/2013  Elsevier Interactive Patient Education 2016 [employer]

## 2015-07-12 NOTE — ED Provider Notes (Signed)
[facility] Emergency Department Provider Note  ____________________________________________  Time seen: Approximately 2:37 PM  I have reviewed the triage vital signs and the nursing notes.   HISTORY  Chief Complaint Medication Refill    HPI Justin [name] is a 20 y.o. male presents emergency department for refill of his albuterol inhaler. Patient has been seen in this department twice previously for same complaint. Patient states that he is tried to have [facility] refill his medications but they state that he should be seen first. The first available appointment is in March. Patient states that his albuterol is used on an as-needed basis. Patient denies any symptoms at this time.   Past Medical History  Diagnosis Date  . Asthma     There are no active problems to display for this patient.   History reviewed. No pertinent past surgical history.  Current Outpatient Rx  Name  Route  Sig  Dispense  Refill  . albuterol (PROVENTIL HFA;VENTOLIN HFA) 108 (90 Base) MCG/ACT inhaler   Inhalation   Inhale 2 puffs into the lungs every 4 (four) hours as needed for wheezing or shortness of breath.   1 Inhaler   1   . cetirizine (ZYRTEC) 5 MG tablet   Oral   Take 1 tablet (5 mg total) by mouth daily.   30 tablet   0   . ondansetron (ZOFRAN ODT) 4 MG disintegrating tablet   Oral   Take 1 tablet (4 mg total) by mouth every 8 (eight) hours as needed for nausea or vomiting.   20 tablet   0   . phenazopyridine (PYRIDIUM) 200 MG tablet   Oral   Take 1 tablet (200 mg total) by mouth 3 (three) times daily as needed for pain.   6 tablet   0   . predniSONE (DELTASONE) 20 MG tablet      3 tablets daily 4 days   12 tablet   0   . sulfamethoxazole-trimethoprim (BACTRIM DS,SEPTRA DS) 800-160 MG tablet   Oral   Take 1 tablet by mouth 2 (two) times daily.   6 tablet   0   . triamcinolone (NASACORT AQ) 55 MCG/ACT AERO nasal inhaler   Nasal   Place 1  spray into the nose 2 (two) times daily.   1 Inhaler   1     Allergies Review of patient's allergies indicates no known allergies.  No family history on file.  Social History Social History  Substance Use Topics  . Smoking status: Current Every Day Smoker -- 0.50 packs/day    Types: Cigarettes  . Smokeless tobacco: None  . Alcohol Use: No     Review of Systems  Constitutional: No fever/chills Cardiovascular: no chest pain. Respiratory: no cough. No SOB. Neurological: Negative for headaches, focal weakness or numbness. 10-point ROS otherwise negative.  ____________________________________________   PHYSICAL EXAM:  VITAL SIGNS: ED Triage Vitals  Enc Vitals Group     BP 07/12/15 1328 161/86 mmHg     Pulse Rate 07/12/15 1328 67     Resp 07/12/15 1328 18     Temp 07/12/15 1328 98.9 F (37.2 C)     Temp Source 07/12/15 1328 Oral     SpO2 07/12/15 1328 99 %     Weight 07/12/15 1328 250 lb (113.399 kg)     Height 07/12/15 1328  (1.854 m)     Head Cir --      Peak Flow --      Pain  Score 07/12/15 1331 1     Pain Loc --      Pain Edu? --      Excl. in GC? --      Constitutional: Alert and oriented. Well appearing and in no acute distress. Head: Atraumatic. Neck: No stridor.   Hematological/Lymphatic/Immunilogical: No cervical lymphadenopathy. Cardiovascular: Normal rate, regular rhythm. Normal S1 and S2.  Good peripheral circulation. Respiratory: Normal respiratory effort without tachypnea or retractions. Lungs CTAB. Good air entry into the bases. No absent or decreased breath sounds. Neurologic:  Normal speech and language. No gross focal neurologic deficits are appreciated.  Skin:  Skin is warm, dry and intact. No rash noted. Psychiatric: Mood and affect are normal. Speech and behavior are normal. Patient exhibits appropriate insight and judgement.   ____________________________________________   LABS (all labs ordered are listed, but only abnormal  results are displayed)  Labs Reviewed - No data to display ____________________________________________  EKG   ____________________________________________  RADIOLOGY   No results found.  ____________________________________________    PROCEDURES  Procedure(s) performed:       Medications - No data to display   ____________________________________________   INITIAL IMPRESSION / ASSESSMENT AND PLAN / ED COURSE  Pertinent labs & imaging results that were available during my care of the patient were reviewed by me and considered in my medical decision making (see chart for details).  Patient's diagnosis is consistent with medication refill for an albuterol inhaler. I have informed the patient that he cannot continue to return to the emergency department for refills of medication. Patient states that he is supposed to see his primary care and [facility] at that time they can continue to refill his medications. Patient is given ED precautions to return to the ED for any worsening or new symptoms.     ____________________________________________  FINAL CLINICAL IMPRESSION(S) / ED DIAGNOSES  Final diagnoses:  Encounter for medication refill      NEW MEDICATIONS STARTED DURING THIS VISIT:  New Prescriptions   ALBUTEROL (PROVENTIL HFA;VENTOLIN HFA) 108 (90 BASE) MCG/ACT INHALER    Inhale 2 puffs into the lungs every 4 (four) hours as needed for wheezing or shortness of breath.        Delorise Royals Walt Geathers, PA-C 07/12/15 1441  Sharyn Creamer, MD 07/12/15 [phone]

## 2015-07-12 NOTE — ED Notes (Addendum)
states pt ran out of his inhaler and the clinic cant get him in until march so they told him to come here for a refill.  No resp distress.

## 2015-08-14 ENCOUNTER — Encounter: Payer: Self-pay | Admitting: *Deleted

## 2015-08-14 ENCOUNTER — Emergency Department
Admission: EM | Admit: 2015-08-14 | Discharge: 2015-08-14 | Disposition: A | Discharge status: Home / Self Care | Payer: Medicaid Other | Attending: Emergency Medicine | Admitting: Emergency Medicine

## 2015-08-14 DIAGNOSIS — Z792 Long term (current) use of antibiotics: Secondary | ICD-10-CM | POA: Diagnosis not present

## 2015-08-14 DIAGNOSIS — Z79899 Other long term (current) drug therapy: Secondary | ICD-10-CM | POA: Diagnosis not present

## 2015-08-14 DIAGNOSIS — Z7951 Long term (current) use of inhaled steroids: Secondary | ICD-10-CM | POA: Insufficient documentation

## 2015-08-14 DIAGNOSIS — J45901 Unspecified asthma with (acute) exacerbation: Secondary | ICD-10-CM | POA: Insufficient documentation

## 2015-08-14 DIAGNOSIS — J45909 Unspecified asthma, uncomplicated: Secondary | ICD-10-CM

## 2015-08-14 DIAGNOSIS — F17210 Nicotine dependence, cigarettes, uncomplicated: Secondary | ICD-10-CM | POA: Insufficient documentation

## 2015-08-14 MED ORDER — PREDNISONE 10 MG PO TABS: ORAL_TABLET | ORAL | Status: DC

## 2015-08-14 MED ORDER — ALBUTEROL SULFATE (2.5 MG/3ML) 0.083% IN NEBU
2.5000 mg | INHALATION_SOLUTION | Freq: Once | RESPIRATORY_TRACT | Status: AC
  Administered 2015-08-14: 2.5 mg via RESPIRATORY_TRACT
  Filled 2015-08-14: qty 3

## 2015-08-14 NOTE — Discharge Instructions (Signed)
Asthma, Adult Asthma is a condition of the lungs in which the airways tighten and narrow. Asthma can make it hard to breathe. Asthma cannot be cured, but medicine and lifestyle changes can help control it. Asthma may be started (triggered) by:  Animal skin flakes (dander).  Dust.  Cockroaches.  Pollen.  Mold.  Smoke.  Cleaning products.  Hair sprays or aerosol sprays.  Paint fumes or strong smells.  Cold air, weather changes, and winds.  Crying or laughing hard.  Stress.  Certain medicines or drugs.  Foods, such as dried fruit, potato chips, and sparkling grape juice.  Infections or conditions (colds, flu).  Exercise.  Certain medical conditions or diseases.  Exercise or tiring activities. HOME CARE   Take medicine as told by your doctor.  Use a peak flow meter as told by your doctor. A peak flow meter is a tool that measures how well the lungs are working.  Record and keep track of the peak flow meter's readings.  Understand and use the asthma action plan. An asthma action plan is a written plan for taking care of your asthma and treating your attacks.  To help prevent asthma attacks:  Do not smoke. Stay away from secondhand smoke.  Change your heating and air conditioning filter often.  Limit your use of fireplaces and wood stoves.  Get rid of pests (such as roaches and mice) and their droppings.  Throw away plants if you see mold on them.  Clean your floors. Dust regularly. Use cleaning products that do not smell.  Have someone vacuum when you are not home. Use a vacuum cleaner with a HEPA filter if possible.  Replace carpet with wood, tile, or vinyl flooring. Carpet can trap animal skin flakes and dust.  Use allergy-proof pillows, mattress covers, and box spring covers.  Wash bed sheets and blankets every week in hot water and dry them in a dryer.  Use blankets that are made of polyester or cotton.  Clean bathrooms and kitchens with bleach.  If possible, have someone repaint the walls in these rooms with mold-resistant paint. Keep out of the rooms that are being cleaned and painted.  Wash hands often. GET HELP IF:  You have make a whistling sound when breaking (wheeze), have shortness of breath, or have a cough even if taking medicine to prevent attacks.  The colored mucus you cough up (sputum) is thicker than usual.  The colored mucus you cough up changes from clear or white to yellow, green, gray, or bloody.  You have problems from the medicine you are taking such as:  A rash.  Itching.  Swelling.  Trouble breathing.  You need reliever medicines more than 2-3 times a week.  Your peak flow measurement is still at 50-79% of your personal best after following the action plan for 1 hour.  You have a fever. GET HELP RIGHT AWAY IF:   You seem to be worse and are not responding to medicine during an asthma attack.  You are short of breath even at rest.  You get short of breath when doing very little activity.  You have trouble eating, drinking, or talking.  You have chest pain.  You have a fast heartbeat.  Your lips or fingernails start to turn blue.  You are light-headed, dizzy, or faint.  Your peak flow is less than 50% of your personal best.   This information is not intended to replace advice given to you by your health care provider. Make sure   you discuss any questions you have with your health care provider.   Document Released: 11/04/2007 Document Revised: 02/06/2015 Document Reviewed: 12/15/2012 Elsevier Interactive Patient Education 2016 [employer]  

## 2015-08-14 NOTE — ED Notes (Signed)
Pt states he needs a breathing treatment, pt does not have any inhalers at home, pt is in no resp distress

## 2015-08-14 NOTE — ED Provider Notes (Signed)
[facility] Emergency Department Provider Note  ____________________________________________  Time seen: Approximately 4:55 PM  I have reviewed the triage vital signs and the nursing notes.   HISTORY  Chief Complaint Asthma    HPI Justin [name] is a 20 y.o. male, NAD, reports the emergency department with asthma exacerbation wheezing. States he's been using his albuterol inhaler home and ran out. Denies any other upper respiratory symptoms. Has not had any specific difficulty breathing. Notes [name] on March 28. Does have Medicaid insurance but has not attempted to seek treatment in any other facility. Utilizes a full Ventolin inhaler every 2-4 weeks and has been doing so for the last 6 months. [facility]   Past Medical History  Diagnosis Date  . Asthma     There are no active problems to display for this patient.   History reviewed. No pertinent past surgical history.  Current Outpatient Rx  Name  Route  Sig  Dispense  Refill  . albuterol (PROVENTIL HFA;VENTOLIN HFA) 108 (90 Base) MCG/ACT inhaler   Inhalation   Inhale 2 puffs into the lungs every 4 (four) hours as needed for wheezing or shortness of breath.   1 Inhaler   1   . cetirizine (ZYRTEC) 5 MG tablet   Oral   Take 1 tablet (5 mg total) by mouth daily.   30 tablet   0   . ondansetron (ZOFRAN ODT) 4 MG disintegrating tablet   Oral   Take 1 tablet (4 mg total) by mouth every 8 (eight) hours as needed for nausea or vomiting.   20 tablet   0   . phenazopyridine (PYRIDIUM) 200 MG tablet   Oral   Take 1 tablet (200 mg total) by mouth 3 (three) times daily as needed for pain.   6 tablet   0   . predniSONE (DELTASONE) 10 MG tablet      Take a daily regimen of 6,5,4,3,2,1   21 tablet   0   . sulfamethoxazole-trimethoprim (BACTRIM DS,SEPTRA DS) 800-160 MG tablet   Oral  Take 1 tablet by mouth 2 (two) times daily.   6 tablet   0   . triamcinolone (NASACORT AQ) 55 MCG/ACT AERO nasal inhaler   Nasal   Place 1 spray into the nose 2 (two) times daily.   1 Inhaler   1     Allergies Review of patient's allergies indicates no known allergies.  No family history on file.  Social History Social History  Substance Use Topics  . Smoking status: Current Every Day Smoker -- 0.50 packs/day    Types: Cigarettes  . Smokeless tobacco: None  . Alcohol Use: No     Review of Systems  Constitutional: No fever/chills, fatigue.  Eyes: No visual changes. No discharge ENT: No sore throat, nasal congestion. Cardiovascular: No chest pain. Respiratory: Positive wheezing. No cough. No shortness of breath.   Skin: Negative for rash. Neurological: Negative for headaches, focal weakness or numbness. 10-point ROS otherwise negative.  ____________________________________________   PHYSICAL EXAM:  VITAL SIGNS: ED Triage Vitals  Enc Vitals Group     BP 08/14/15 1646 144/91 mmHg     Pulse Rate 08/14/15 1646 64     Resp 08/14/15 1646 18     Temp 08/14/15 1646 97.4 F (36.3 C)     Temp Source 08/14/15 1646 Oral     SpO2 08/14/15 1646 97 %  Weight 08/14/15 1646 250 lb (113.399 kg)     Height 08/14/15 1646  (1.854 m)     Head Cir --      Peak Flow --      Pain Score --      Pain Loc --      Pain Edu? --      Excl. in GC? --     Constitutional: Alert and oriented. Well appearing and in no acute distress. Eyes: Conjunctivae are normal.  Head: Atraumatic.  Neck: No stridor. Supple with FROM.  Hematological/Lymphatic/Immunilogical: No cervical lymphadenopathy. Cardiovascular:  Good peripheral circulation. Respiratory: Normal respiratory effort without tachypnea or retractions. Mild wheeze noted throughout bilateral lungs. Breath sounds noted throughout. No rhonchi or rales. Neurologic:  Normal speech and language. No gross focal neurologic deficits  are appreciated.  Skin:  Skin is warm, dry and intact. No rash noted. Psychiatric: Mood and affect are normal. Speech and behavior are normal. Patient exhibits appropriate insight and judgement.   ____________________________________________   LABS  None ____________________________________________  EKG  None ____________________________________________  RADIOLOGY  None ____________________________________________    PROCEDURES  Procedure(s) performed: None    Medications  albuterol (PROVENTIL) (2.5 MG/3ML) 0.083% nebulizer solution 2.5 mg (2.5 mg Nebulization Given 08/14/15 1713)   Respiratory exam after albuterol noted CTAB. No further wheeze.    ____________________________________________   INITIAL IMPRESSION / ASSESSMENT AND PLAN / ED COURSE  Patient's diagnosis is consistent with uncontrolled asthma. I had a long discussion with the patient in regards to control of his asthma. Advise he keep his appointment with [facility] so that his asthma can be properly managed outpatient. We also discussed other options of outpatient care facilities that excepted Medicaid insurance in the case the [facility] could not see him. Patient will be discharged home with prescriptions for prednisone to take as directed. Patient has a refill on his current prescription for albuterol inhaler that he received in this emergency department one month ago. He is instructed to fill that refill. Patient is to follow up with [facility] if symptoms persist past this treatment course. Patient is given ED precautions to return to the ED for any worsening or new symptoms.    ____________________________________________  FINAL CLINICAL IMPRESSION(S) / ED DIAGNOSES  Final diagnoses:  Asthma, unspecified asthma severity, uncomplicated      NEW MEDICATIONS STARTED DURING THIS VISIT:  New Prescriptions   PREDNISONE (DELTASONE) 10 MG TABLET    Take a  daily regimen of 6,5,4,3,2,1         Hope Pigeon, PA-C 08/14/15 1734  Arnaldo Natal, MD 08/14/15 2038

## 2015-12-14 ENCOUNTER — Emergency Department
Admission: EM | Admit: 2015-12-14 | Discharge: 2015-12-14 | Disposition: A | Discharge status: Home / Self Care | Payer: Medicaid Other | Attending: Emergency Medicine | Admitting: Emergency Medicine

## 2015-12-14 ENCOUNTER — Encounter: Payer: Self-pay | Admitting: Emergency Medicine

## 2015-12-14 DIAGNOSIS — Z79899 Other long term (current) drug therapy: Secondary | ICD-10-CM | POA: Diagnosis not present

## 2015-12-14 DIAGNOSIS — Z7951 Long term (current) use of inhaled steroids: Secondary | ICD-10-CM | POA: Diagnosis not present

## 2015-12-14 DIAGNOSIS — J45909 Unspecified asthma, uncomplicated: Secondary | ICD-10-CM | POA: Diagnosis not present

## 2015-12-14 DIAGNOSIS — N4830 Priapism, unspecified: Secondary | ICD-10-CM

## 2015-12-14 DIAGNOSIS — N4889 Other specified disorders of penis: Secondary | ICD-10-CM | POA: Diagnosis present

## 2015-12-14 DIAGNOSIS — F17210 Nicotine dependence, cigarettes, uncomplicated: Secondary | ICD-10-CM | POA: Insufficient documentation

## 2015-12-14 HISTORY — DX: Priapism, unspecified: N48.30

## 2015-12-14 LAB — CBC
HCT: 47.1 % (ref 40.0–52.0)
HEMOGLOBIN: 16.4 g/dL (ref 13.0–18.0)
MCH: 30.7 pg (ref 26.0–34.0)
MCHC: 34.9 g/dL (ref 32.0–36.0)
MCV: 88.1 fL (ref 80.0–100.0)
PLATELETS: 224 10*3/uL (ref 150–440)
RBC: 5.34 MIL/uL (ref 4.40–5.90)
RDW: 13.4 % (ref 11.5–14.5)
WBC: 7.4 10*3/uL (ref 3.8–10.6)

## 2015-12-14 LAB — BASIC METABOLIC PANEL
Anion gap: 5 (ref 5–15)
BUN: 12 mg/dL (ref 6–20)
CHLORIDE: 107 mmol/L (ref 101–111)
CO2: 28 mmol/L (ref 22–32)
CREATININE: 0.96 mg/dL (ref 0.61–1.24)
Calcium: 8.8 mg/dL — ABNORMAL LOW (ref 8.9–10.3)
Glucose, Bld: 103 mg/dL — ABNORMAL HIGH (ref 65–99)
POTASSIUM: 4.3 mmol/L (ref 3.5–5.1)
SODIUM: 140 mmol/L (ref 135–145)

## 2015-12-14 MED ORDER — HYDROCODONE-ACETAMINOPHEN 5-325 MG PO TABS: 1.0000 | ORAL_TABLET | ORAL | Status: DC | PRN

## 2015-12-14 MED ORDER — HYDROMORPHONE HCL 1 MG/ML IJ SOLN
1.0000 mg | Freq: Once | INTRAMUSCULAR | Status: AC
  Administered 2015-12-14: 1 mg via INTRAVENOUS
  Filled 2015-12-14: qty 1

## 2015-12-14 MED ORDER — SODIUM CHLORIDE 0.9 % IV BOLUS (SEPSIS)
1000.0000 mL | Freq: Once | INTRAVENOUS | Status: AC
  Administered 2015-12-14: 1000 mL via INTRAVENOUS

## 2015-12-14 MED ORDER — PSEUDOEPHEDRINE HCL 30 MG PO TABS
60.0000 mg | ORAL_TABLET | Freq: Once | ORAL | Status: AC
  Administered 2015-12-14: 60 mg via ORAL
  Filled 2015-12-14: qty 1

## 2015-12-14 NOTE — ED Notes (Addendum)
Pt to ed with c/o priapism since waking this am.  Pt states similar episode happened 2 weeks ago but it went away.

## 2015-12-14 NOTE — ED Provider Notes (Signed)
[facility] Emergency Department Provider Note  Time seen: 10:53 AM  I have reviewed the triage vital signs and the nursing notes.   HISTORY  Chief Complaint Groin Swelling    HPI Justin [name] is a 20 y.o. male with a past medical history of asthma and priapism presents to the emergency department with an erection since 9 AM this morning. Patient states this happened twice this week but it has gone away both times. He states this morning he noted an erection at 9 AM. He got up and walked around to see if it would go down but it did not so he came to the emergency department for evaluation. Patient states significant penile pain. Denies any recent drug use. Denies ever taking Viagra or Cialis. Denies history of sickle cell.     Past Medical History  Diagnosis Date  . Asthma   . Priapism     There are no active problems to display for this patient.   History reviewed. No pertinent past surgical history.  Current Outpatient Rx  Name  Route  Sig  Dispense  Refill  . albuterol (PROVENTIL HFA;VENTOLIN HFA) 108 (90 Base) MCG/ACT inhaler   Inhalation   Inhale 2 puffs into the lungs every 4 (four) hours as needed for wheezing or shortness of breath.   1 Inhaler   1   . cetirizine (ZYRTEC) 5 MG tablet   Oral   Take 1 tablet (5 mg total) by mouth daily.   30 tablet   0   . ondansetron (ZOFRAN ODT) 4 MG disintegrating tablet   Oral   Take 1 tablet (4 mg total) by mouth every 8 (eight) hours as needed for nausea or vomiting.   20 tablet   0   . phenazopyridine (PYRIDIUM) 200 MG tablet   Oral   Take 1 tablet (200 mg total) by mouth 3 (three) times daily as needed for pain.   6 tablet   0   . predniSONE (DELTASONE) 10 MG tablet      Take a daily regimen of 6,5,4,3,2,1   21 tablet   0   . sulfamethoxazole-trimethoprim (BACTRIM DS,SEPTRA DS) 800-160 MG tablet   Oral   Take 1 tablet by mouth 2 (two) times daily.   6 tablet   0   .  triamcinolone (NASACORT AQ) 55 MCG/ACT AERO nasal inhaler   Nasal   Place 1 spray into the nose 2 (two) times daily.   1 Inhaler   1     Allergies Review of patient's allergies indicates no known allergies.  History reviewed. No pertinent family history.  Social History Social History  Substance Use Topics  . Smoking status: Current Every Day Smoker -- 0.50 packs/day    Types: Cigarettes  . Smokeless tobacco: None  . Alcohol Use: No    Review of Systems Constitutional: Negative for fever. Cardiovascular: Negative for chest pain. Respiratory: Negative for shortness of breath. Gastrointestinal: Negative for abdominal pain Genitourinary: Positive for erection since 9 AM. Musculoskeletal: Negative for back pain. Neurological: Negative for headache 10-point ROS otherwise negative.  ____________________________________________   PHYSICAL EXAM:  VITAL SIGNS: ED Triage Vitals  Enc Vitals Group     BP 12/14/15 1037 184/127 mmHg     Pulse Rate 12/14/15 1037 90     Resp 12/14/15 1037 18     Temp 12/14/15 1037 97.6 F (36.4 C)     Temp Source 12/14/15 1037 Oral     SpO2 12/14/15 1037  95 %     Weight 12/14/15 1037 250 lb (113.399 kg)     Height 12/14/15 1037  (1.854 m)     Head Cir --      Peak Flow --      Pain Score 12/14/15 1038 10     Pain Loc --      Pain Edu? --      Excl. in GC? --     Constitutional: Alert and oriented. Well appearing and in no distress. Eyes: Normal exam ENT   Head: Normocephalic and atraumatic.   Mouth/Throat: Mucous membranes are moist. Cardiovascular: Normal rate, regular rhythm. No murmur Respiratory: Normal respiratory effort without tachypnea nor retractions. Breath sounds are clear  Gastrointestinal: Soft and nontender. No distention. GU exam shows firm erection, moderate tenderness to palpation of the penis, normal testicular exam. Musculoskeletal: Nontender with normal range of motion in all extremities Neurologic:   Normal speech and language. No gross focal neurologic deficits  Skin:  Skin is warm, dry and intact.  Psychiatric: Mood and affect are normal.   ____________________________________________    INITIAL IMPRESSION / ASSESSMENT AND PLAN / ED COURSE  Pertinent labs & imaging results that were available during my care of the patient were reviewed by me and considered in my medical decision making (see chart for details).  The patient presents the emergency department with an erection for the past 2 hours. We will check labs, dose pain medication, IV fluids and oxygen. We will discuss with Dr. Apolinar Junes for further recommendations.  ----------------------------------------- 1:02 PM on 12/14/2015 -----------------------------------------  Patient's priapism has completely resolved. I discussed the patient with Dr. Apolinar Junes. Per her request I have ordered a hemoglobinopathy lab evaluation, so the patient is again seen in the emergency department we may have a better idea as to why the patient is experiencing recurrent episodes of priapism.  ____________________________________________   FINAL CLINICAL IMPRESSION(S) / ED DIAGNOSES  Priapism   Minna Antis, MD 12/14/15 1303

## 2015-12-14 NOTE — Discharge Instructions (Signed)
Priapism °Priapism is an unwanted erection of the penis that usually develops without sexual stimulation or desire. Priapism affects males of all ages. There are three types of priapism: °· Recurrent acute priapism. With this type, erections are painful and last less than 3 hours. The erections come and go. °· Acute prolonged priapism. With this type, erections are painful and last hours to days. This type can lead to erectile dysfunction. °· Persistent priapism. With this type, erections are usually painless and can last weeks to years. The penis gets erect but not rigid. This type can lead to erectile dysfunction. °CAUSES °This condition develops either when blood has difficulty leaving the penis (low-flow priapism) or if too much blood flows into the penis (high-flow priapism). Blood flow issues may be caused by: °· Erectile dysfunction medicine. This is the most common cause. °· Medicine that is used to treat depression and anxiety. °· Blood problems that are common in people who have sickle cell disease or leukemia. °· Use of illegal drugs, such as cocaine and marijuana. °· Excessive alcohol use. °· Neurological problems, such as multiple sclerosis. °· Diabetes mellitus. °· An injury to the penis. °· An infection. °In some cases, the cause may not be known. °SYMPTOMS °Symptoms of this condition include: °· A prolonged erection. °· A painful erection. °DIAGNOSIS °This condition is diagnosed with a physical exam. Blood tests may be done to help identify the cause of the condition. °TREATMENT °Treatment for this condition depends on the cause and the type of priapism. Recurrent acute priapism is often managed at home. Acute prolonged priapism is usually treated at a hospital, where treatment may involve: °· Getting fluid and medicines for pain through an IV tube. °· A blood transfusion. °· A procedure to drain blood from the penis. °· Surgery to make a passageway for blood to flow in the penis (surgical  shunting). °No standard treatment exists for persistent priapism. °HOME CARE INSTRUCTIONS °Managing Recurrent Priapism °· Try taking a warm bath or exercising. °· Keep track of how long your erection lasts. If it does not go away in 3 hours, seek medical care. °General Instructions °· Avoid sexual stimulation and intercourse until your health care provider says that they are okay for you. °· Avoid drugs or alcohol if they caused the priapism. Avoiding them can help to prevent the condition from coming back. °· Drink enough fluid to keep your urine clear or pale yellow. °· Empty your bladder as much as possible. °· Take over-the-counter and prescription medicines only as told by your health care provider. °· Do not take any medicines during an episode unless you get approval from your health care provider. °· Keep all follow-up visits as told by your health care provider. This is important. °SEEK MEDICAL CARE IF: °· Your pain gets worse. °· Your pain does not improve with treatment. °· You have recurrent priapism and your erection does not go away in 3 hours. °SEEK IMMEDIATE MEDICAL CARE IF: °· You have a fever or chills. °· You have pain, swelling, or redness in your genitals or your groin area. °  °This information is not intended to replace advice given to you by your health care provider. Make sure you discuss any questions you have with your health care provider. °  °Document Released: 08/08/2003 Document Revised: 02/06/2015 Document Reviewed: 08/21/2014 °[employer]vier Interactive Patient Education ©2016 [employer] ° °

## 2015-12-17 ENCOUNTER — Emergency Department
Admission: EM | Admit: 2015-12-17 | Discharge: 2015-12-17 | Disposition: A | Discharge status: Home / Self Care | Payer: Medicaid Other | Attending: Emergency Medicine | Admitting: Emergency Medicine

## 2015-12-17 ENCOUNTER — Encounter: Payer: Self-pay | Admitting: Emergency Medicine

## 2015-12-17 DIAGNOSIS — R0602 Shortness of breath: Secondary | ICD-10-CM | POA: Diagnosis present

## 2015-12-17 DIAGNOSIS — J45909 Unspecified asthma, uncomplicated: Secondary | ICD-10-CM | POA: Diagnosis not present

## 2015-12-17 DIAGNOSIS — F17210 Nicotine dependence, cigarettes, uncomplicated: Secondary | ICD-10-CM | POA: Diagnosis not present

## 2015-12-17 DIAGNOSIS — Z760 Encounter for issue of repeat prescription: Secondary | ICD-10-CM | POA: Insufficient documentation

## 2015-12-17 MED ORDER — ALBUTEROL SULFATE (2.5 MG/3ML) 0.083% IN NEBU
INHALATION_SOLUTION | RESPIRATORY_TRACT | Status: AC
  Filled 2015-12-17: qty 3

## 2015-12-17 MED ORDER — ALBUTEROL SULFATE (2.5 MG/3ML) 0.083% IN NEBU
5.0000 mg | INHALATION_SOLUTION | Freq: Once | RESPIRATORY_TRACT | Status: AC
  Administered 2015-12-17: 5 mg via RESPIRATORY_TRACT

## 2015-12-17 MED ORDER — ALBUTEROL SULFATE HFA 108 (90 BASE) MCG/ACT IN AERS: 2.0000 | INHALATION_SPRAY | RESPIRATORY_TRACT | Status: DC | PRN

## 2015-12-17 NOTE — ED Provider Notes (Signed)
[facility]  ____________________________________________  Time seen: Approximately 2:27 PM  I have reviewed the triage vital signs and the nursing notes.   HISTORY  Chief Complaint Shortness of Breath   HPI Justin [name] is a 20 y.o. male patient presents for complaint of wheezing, but reports wheezing now resolved. Patient reports that he does have a chronic history of asthma in which he usually uses his albuterol inhaler. Patient reports that he uses his albuterol inhaler occasionally, denies daily use. Patient states that he has ran out of his albuterol inhaler. Patient reports that he does have intermittent wheezing with his asthma at baseline.  Patient reports he had very minimal wheezing last night and states he had intermittent mild wheezing today prompting him to come in and be evaluated. Denies known trigger for wheezing, which is similar to his chronic asthma. Patient reports that his mother does smoke in the house but states this is been throughout his life. Denies recent sickness.  Patient requests prescription for albuterol inhaler. Patient reports that he was unable to be seen by his primary physician today. Patient states that he usually comes to the emergency room for medication refill as it is more convenient. Patient denies any recent sickness, cough, congestion, fevers, chest pain, shortness of breath or chest pain with deep breath. Denies recent surgery, fevers, extremity pain or swelling. Reports has remained active. Patient reports he feels well now.  Patient states that today when he was wheezing the wheezing was described as mild. States he did not have accompanying shortness of breath. Patient states that he just did not want to chance that his symptoms would worsen without his albuterol inhaler at home.  Patient was recently seen in emergency room 3 days ago for sustained erection and patient reports that this issue has since resolved and denies any penile  or testicular complaints.  PCP: [facility]   Past Medical History  Diagnosis Date  . Asthma   . Priapism     There are no active problems to display for this patient.   History reviewed. No pertinent past surgical history.  Current Outpatient Rx  Name  Route  Sig  Dispense  Refill  .           .           .           .           .           .           .           .             Allergies Review of patient's allergies indicates no known allergies.  No family history on file.  Social History Social History  Substance Use Topics  . Smoking status: Current Every Day Smoker -- 0.50 packs/day    Types: Cigarettes  . Smokeless tobacco: None  . Alcohol Use: No    Review of Systems Constitutional: No fever/chills Eyes: No visual changes. ENT: No sore throat. Cardiovascular: Denies chest pain. Respiratory: Denies shortness of breath. Gastrointestinal: No abdominal pain.  No nausea, no vomiting.  No diarrhea.  No constipation. Genitourinary: Negative for dysuria. Musculoskeletal: Negative for back pain. Skin: Negative for rash. Neurological: Negative for headaches, focal weakness or numbness.  10-point ROS otherwise negative.  ____________________________________________   PHYSICAL EXAM:  VITAL SIGNS: ED Triage Vitals  Enc Vitals Group  BP 12/17/15 1347 166/98 mmHg     Pulse Rate 12/17/15 1347 66     Resp 12/17/15 1347 16     Temp 12/17/15 1347 98.1 F (36.7 C)     Temp Source 12/17/15 1347 Oral     SpO2 12/17/15 1347 97 %     Weight 12/17/15 1347 250 lb (113.399 kg)     Height 12/17/15 1347 6\' 1"  (1.854 m)     Head Cir --      Peak Flow --      Pain Score --      Pain Loc --      Pain Edu? --      Excl. in GC? --     Constitutional: Alert and oriented. Well appearing and in no acute distress. Eyes: Conjunctivae are normal. PERRL. EOMI. Head: Atraumatic.  Ears: no erythema, normal TMs bilaterally.   Nose: No  congestion/rhinnorhea.  Mouth/Throat: Mucous membranes are moist.  Oropharynx non-erythematous. Neck: No stridor.  No cervical spine tenderness to palpation. Hematological/Lymphatic/Immunilogical: No cervical lymphadenopathy. Cardiovascular: Normal rate, regular rhythm. Grossly normal heart sounds.  Good peripheral circulation. Respiratory: Normal respiratory effort.  No retractions. Lungs CTAB.No wheezes, rales or rhonchi. Speaks in complete sentences. Good air movement.  Gastrointestinal: Soft and nontender. No distention. Normal Bowel sounds. No CVA tenderness. Musculoskeletal: No lower or upper extremity tenderness nor edema.    Neurologic:  Normal speech and language. No gross focal neurologic deficits are appreciated. No gait instability. Skin:  Skin is warm, dry and intact. No rash noted. Psychiatric: Mood and affect are normal. Speech and behavior are normal.  ____________________________________________   LABS (all labs ordered are listed, but only abnormal results are displayed)  Labs Reviewed - No data to display   INITIAL IMPRESSION / ASSESSMENT AND PLAN / ED COURSE  Pertinent labs & imaging results that were available during my care of the patient were reviewed by me and considered in my medical decision making (see chart for details).  Very well-appearing patient. No acute distress. Presenting for request of the prescription refill of his albuterol inhaler. Patient denies any complaints at this time. Patient was given 1 albuterol nebulizer solution treatment from triage prior to being evaluated. Patient states the nebulizer fully resolved any wheezing that he was having. Patient denies any complaints at this time. Denies any chest pain, shortness of breath, wheezing or chest tightness. Patient states that he feels well. Encouraged patient to have close primary follow-up at his primary at [facility] this week to ensure asthma is being properly managed in follow-up as well as  to follow-up on blood pressure readings in emergency room recently. Prescription given for albuterol inhaler to use as needed. Discussed with patient regarding treatment, patient declines need for prednisone or decadron. Patient does not appear to be in asthma exacerbation. Symptoms per patient consistent with his chronic asthma. Counseled regarding smoking cessation. Also counseled strict return parameters.    Discussed follow up with Primary care physician this week. Discussed follow up and return parameters including no resolution or any worsening concerns. Patient verbalized understanding and agreed to plan.   ____________________________________________   FINAL CLINICAL IMPRESSION(S) / ED DIAGNOSES  Final diagnoses:  Encounter for medication refill  Chronic asthma without complication     Discharge Medication List as of 12/17/2015  2:30 PM    START taking these medications   Details  !! albuterol (PROVENTIL HFA;VENTOLIN HFA) 108 (90 Base) MCG/ACT inhaler Inhale 2 puffs into the lungs every 4 (four)  hours as needed for wheezing or shortness of breath., Starting 12/17/2015, Until Discontinued, Print     !! - Potential duplicate medications found. Please discuss with provider.      Note: This dictation was prepared with Dragon dictation along with smaller phrase technology. Any transcriptional errors that result from this process are unintentional.       Renford DillsLindsey Nonnie Pickney, NP 12/17/15 1725  Sharman CheekPhillip Stafford, MD 12/17/15 2011

## 2015-12-17 NOTE — Discharge Instructions (Signed)
Use medication as prescribed. Avoid any triggers.   Follow up with your primary care physician as discussed. Return to ER for new or worsening concerns.    Asthma, Adult Asthma is a condition of the lungs in which the airways tighten and narrow. Asthma can make it hard to breathe. Asthma cannot be cured, but medicine and lifestyle changes can help control it. Asthma may be started (triggered) by:  Animal skin flakes (dander).  Dust.  Cockroaches.  Pollen.  Mold.  Smoke.  Cleaning products.  Hair sprays or aerosol sprays.  Paint fumes or strong smells.  Cold air, weather changes, and winds.  Crying or laughing hard.  Stress.  Certain medicines or drugs.  Foods, such as dried fruit, potato chips, and sparkling grape juice.  Infections or conditions (colds, flu).  Exercise.  Certain medical conditions or diseases.  Exercise or tiring activities. HOME CARE   Take medicine as told by your doctor.  Use a peak flow meter as told by your doctor. A peak flow meter is a tool that measures how well the lungs are working.  Record and keep track of the peak flow meter's readings.  Understand and use the asthma action plan. An asthma action plan is a written plan for taking care of your asthma and treating your attacks.  To help prevent asthma attacks:  Do not smoke. Stay away from secondhand smoke.  Change your heating and air conditioning filter often.  Limit your use of fireplaces and wood stoves.  Get rid of pests (such as roaches and mice) and their droppings.  Throw away plants if you see mold on them.  Clean your floors. Dust regularly. Use cleaning products that do not smell.  Have someone vacuum when you are not home. Use a vacuum cleaner with a HEPA filter if possible.  Replace carpet with wood, tile, or vinyl flooring. Carpet can trap animal skin flakes and dust.  Use allergy-proof pillows, mattress covers, and box spring covers.  Wash bed sheets  and blankets every week in hot water and dry them in a dryer.  Use blankets that are made of polyester or cotton.  Clean bathrooms and kitchens with bleach. If possible, have someone repaint the walls in these rooms with mold-resistant paint. Keep out of the rooms that are being cleaned and painted.  Wash hands often. GET HELP IF:  You have make a whistling sound when breaking (wheeze), have shortness of breath, or have a cough even if taking medicine to prevent attacks.  The colored mucus you cough up (sputum) is thicker than usual.  The colored mucus you cough up changes from clear or white to yellow, green, gray, or bloody.  You have problems from the medicine you are taking such as:  A rash.  Itching.  Swelling.  Trouble breathing.  You need reliever medicines more than 2-3 times a week.  Your peak flow measurement is still at 50-79% of your personal best after following the action plan for 1 hour.  You have a fever. GET HELP RIGHT AWAY IF:   You seem to be worse and are not responding to medicine during an asthma attack.  You are short of breath even at rest.  You get short of breath when doing very little activity.  You have trouble eating, drinking, or talking.  You have chest pain.  You have a fast heartbeat.  Your lips or fingernails start to turn blue.  You are light-headed, dizzy, or faint.  Your peak  flow is less than 50% of your personal best. °  °This information is not intended to replace advice given to you by your health care provider. Make sure you discuss any questions you have with your health care provider. °  °Document Released: 11/04/2007 Document Revised: 02/06/2015 Document Reviewed: 12/15/2012 °Elsevier Interactive Patient Education ©2016 [employer] ° °

## 2015-12-17 NOTE — ED Notes (Signed)
Pt reports shortness of breath today, reports hx of asthma. Pt states he is out of his inhaler. Pt with even and nonlabored respirations noted, no distress.

## 2015-12-19 ENCOUNTER — Telehealth: Payer: Self-pay | Admitting: Emergency Medicine

## 2015-12-19 NOTE — Telephone Encounter (Signed)
Called pcp--[facility]--they said he made appt but did not come in.  Called pt and he says he was called yesterday to come in and give more blood, but he could not come in.  i advised him to call [facility] or a follow up doctor and see if they can do the test there.

## 2015-12-31 ENCOUNTER — Emergency Department
Admission: EM | Admit: 2015-12-31 | Discharge: 2015-12-31 | Disposition: A | Discharge status: Home / Self Care | Payer: Medicaid Other | Attending: Emergency Medicine | Admitting: Emergency Medicine

## 2015-12-31 ENCOUNTER — Encounter: Payer: Self-pay | Admitting: Medical Oncology

## 2015-12-31 DIAGNOSIS — Z79899 Other long term (current) drug therapy: Secondary | ICD-10-CM | POA: Insufficient documentation

## 2015-12-31 DIAGNOSIS — R0602 Shortness of breath: Secondary | ICD-10-CM | POA: Diagnosis present

## 2015-12-31 DIAGNOSIS — J4520 Mild intermittent asthma, uncomplicated: Secondary | ICD-10-CM | POA: Insufficient documentation

## 2015-12-31 DIAGNOSIS — F17210 Nicotine dependence, cigarettes, uncomplicated: Secondary | ICD-10-CM | POA: Diagnosis not present

## 2015-12-31 MED ORDER — ALBUTEROL SULFATE (2.5 MG/3ML) 0.083% IN NEBU
5.0000 mg | INHALATION_SOLUTION | Freq: Once | RESPIRATORY_TRACT | Status: AC
  Administered 2015-12-31: 5 mg via RESPIRATORY_TRACT
  Filled 2015-12-31: qty 6

## 2015-12-31 MED ORDER — ALBUTEROL SULFATE HFA 108 (90 BASE) MCG/ACT IN AERS: 2.0000 | INHALATION_SPRAY | RESPIRATORY_TRACT | 1 refills | Status: DC | PRN

## 2015-12-31 MED ORDER — ALBUTEROL SULFATE (2.5 MG/3ML) 0.083% IN NEBU
INHALATION_SOLUTION | RESPIRATORY_TRACT | Status: AC
  Filled 2015-12-31: qty 3

## 2015-12-31 NOTE — ED Triage Notes (Signed)
Pt has history of asthma, since last night pt reports he has been feeling sob. NAD noted, mild wheezing.

## 2015-12-31 NOTE — ED Provider Notes (Signed)
[facility] Emergency Department Provider Note  ____________________________________________   None    (approximate)  I have reviewed the triage vital signs and the nursing notes.   HISTORY  Chief Complaint Shortness of Breath and Asthma   HPI Justin [name] is a 20 y.o. male is here complaining of shortness of breath due to his asthma. Patient states that he has a long-acting inhaler at home but has ran out of his short acting inhaler and does not have a PCP. Patient denies any present shortness of breath or wheezing. He states he wheezed "a little bit last night". He denies any fever, chills, cough or congestion. Patient also is a smoker. Patient denies any pain at this time. He is unaware of any thing that makes his asthma worse.Patient states that he has made an appointment at [facility] health care but needs an inhaler today.   Past Medical History:  Diagnosis Date  . Asthma   . Priapism     There are no active problems to display for this patient.   History reviewed. No pertinent surgical history.  Prior to Admission medications   Medication Sig Start Date End Date Taking? Authorizing Provider  albuterol (PROVENTIL HFA;VENTOLIN HFA) 108 (90 Base) MCG/ACT inhaler Inhale 2 puffs into the lungs every 4 (four) hours as needed for wheezing or shortness of breath. 12/31/15   Tommi Rumps, PA-C  cetirizine (ZYRTEC) 5 MG tablet Take 1 tablet (5 mg total) by mouth daily. 12/28/14   Jenise V Bacon Menshew, PA-C  HYDROcodone-acetaminophen (NORCO/VICODIN) 5-325 MG tablet Take 1 tablet by mouth every 4 (four) hours as needed. 12/14/15   Minna Antis, MD  ondansetron (ZOFRAN ODT) 4 MG disintegrating tablet Take 1 tablet (4 mg total) by mouth every 8 (eight) hours as needed for nausea or vomiting. 04/03/15   Tommi Rumps, PA-C  phenazopyridine (PYRIDIUM) 200 MG tablet Take 1 tablet (200 mg total) by mouth 3 (three) times daily as needed for pain.  04/30/15   Evangeline Dakin, PA-C  predniSONE (DELTASONE) 10 MG tablet Take a daily regimen of 6,5,4,3,2,1 08/14/15   Jami L Hagler, PA-C  sulfamethoxazole-trimethoprim (BACTRIM DS,SEPTRA DS) 800-160 MG tablet Take 1 tablet by mouth 2 (two) times daily. 04/30/15   Charmayne Sheer Beers, PA-C  triamcinolone (NASACORT AQ) 55 MCG/ACT AERO nasal inhaler Place 1 spray into the nose 2 (two) times daily. 12/28/14 12/28/15  Jenise V Bacon Menshew, PA-C    Allergies Review of patient's allergies indicates no known allergies.  No family history on file.  Social History Social History  Substance Use Topics  . Smoking status: Current Every Day Smoker    Packs/day: 0.50    Types: Cigarettes  . Smokeless tobacco: Not on file  . Alcohol use No    Review of Systems Constitutional: No fever/chills ENT: No sore throat. Cardiovascular: Denies chest pain. Respiratory: Denies shortness of breath. Skin: Negative for rash. Neurological: Negative for headaches  10-point ROS otherwise negative.  ____________________________________________   PHYSICAL EXAM:  VITAL SIGNS: ED Triage Vitals  Enc Vitals Group     BP 12/31/15 1217 (!) 160/98     Pulse Rate 12/31/15 1217 73     Resp 12/31/15 1217 16     Temp 12/31/15 1217 98.3 F (36.8 C)     Temp Source 12/31/15 1217 Oral     SpO2 12/31/15 1217 98 %     Weight 12/31/15 1217 250 lb (113.4 kg)     Height 12/31/15  1217  (1.854 m)     Head Circumference --      Peak Flow --      Pain Score 12/31/15 1218 0     Pain Loc --      Pain Edu? --      Excl. in GC? --     Constitutional: Alert and oriented. Well appearing and in no acute distress while being on his cell phone. Eyes: Conjunctivae are normal. PERRL. EOMI. Head: Atraumatic. Nose: No congestion/rhinnorhea. Mouth/Throat: Mucous membranes are moist.  Oropharynx non-erythematous. Neck: No stridor.   Hematological/Lymphatic/Immunilogical: No cervical lymphadenopathy. Cardiovascular: Normal  rate, regular rhythm. Grossly normal heart sounds.  Good peripheral circulation. Respiratory: Normal respiratory effort.  No retractions. Lungs CTAB.Patient is able to talk in complete sentences and is not having any respiratory difficulty at this time. Musculoskeletal: Moves upper and lower extremities without any difficulty. Normal gait was noted. Neurologic:  Normal speech and language. No gross focal neurologic deficits are appreciated. No gait instability. Skin:  Skin is warm, dry and intact. No rash noted. Psychiatric: Mood and affect are normal. Speech and behavior are normal.  ____________________________________________   LABS (all labs ordered are listed, but only abnormal results are displayed)  Labs Reviewed - No data to display  PROCEDURES  Procedure(s) performed: None  Procedures  Critical Care performed: No  ____________________________________________   INITIAL IMPRESSION / ASSESSMENT AND PLAN / ED COURSE  Pertinent labs & imaging results that were available during my care of the patient were reviewed by me and considered in my medical decision making (see chart for details).    Clinical Course   Patient was given a prescription for an albuterol inhaler. Patient is encouraged to keep his appointment with [facility] healthcare and to get the remainder of his asthma medicine on a regular basis from his primary care doctor. Patient was also encouraged to discontinue smoking.  ____________________________________________   FINAL CLINICAL IMPRESSION(S) / ED DIAGNOSES  Final diagnoses:  Asthma, mild intermittent, uncomplicated      NEW MEDICATIONS STARTED DURING THIS VISIT:  Discharge Medication List as of 12/31/2015  1:10 PM       Note:  This document was prepared using Dragon voice recognition software and may include unintentional dictation errors.    Tommi Rumps, PA-C 12/31/15 1455    Jennye Moccasin, MD 12/31/15 [phone]

## 2015-12-31 NOTE — Discharge Instructions (Signed)
Keep your appointment that you have made with [name]. Use inhaler as directed. There is also one refill on this prescription however ask your primary care doctor to continue your prescriptions. Discontinue smoking.

## 2016-03-20 ENCOUNTER — Emergency Department: Payer: Medicaid Other

## 2016-03-20 ENCOUNTER — Encounter: Payer: Self-pay | Admitting: *Deleted

## 2016-03-20 ENCOUNTER — Emergency Department
Admission: EM | Admit: 2016-03-20 | Discharge: 2016-03-20 | Disposition: A | Discharge status: Home / Self Care | Payer: Medicaid Other | Attending: Emergency Medicine | Admitting: Emergency Medicine

## 2016-03-20 DIAGNOSIS — F17210 Nicotine dependence, cigarettes, uncomplicated: Secondary | ICD-10-CM | POA: Insufficient documentation

## 2016-03-20 DIAGNOSIS — Z79899 Other long term (current) drug therapy: Secondary | ICD-10-CM | POA: Insufficient documentation

## 2016-03-20 DIAGNOSIS — R062 Wheezing: Secondary | ICD-10-CM | POA: Diagnosis present

## 2016-03-20 DIAGNOSIS — J45901 Unspecified asthma with (acute) exacerbation: Secondary | ICD-10-CM | POA: Insufficient documentation

## 2016-03-20 MED ORDER — ALBUTEROL SULFATE HFA 108 (90 BASE) MCG/ACT IN AERS: 2.0000 | INHALATION_SPRAY | Freq: Four times a day (QID) | RESPIRATORY_TRACT | 1 refills | Status: DC | PRN

## 2016-03-20 MED ORDER — ALBUTEROL SULFATE (2.5 MG/3ML) 0.083% IN NEBU
5.0000 mg | INHALATION_SOLUTION | Freq: Once | RESPIRATORY_TRACT | Status: AC
  Administered 2016-03-20: 5 mg via RESPIRATORY_TRACT

## 2016-03-20 MED ORDER — ALBUTEROL SULFATE (2.5 MG/3ML) 0.083% IN NEBU
INHALATION_SOLUTION | RESPIRATORY_TRACT | Status: AC
  Filled 2016-03-20: qty 6

## 2016-03-20 NOTE — ED Triage Notes (Signed)
Pt reports he feels like he has been wheezing since he woke up today, out of inhaler for about 3 days.

## 2016-03-20 NOTE — ED Notes (Signed)
Pt discharged to home.  Family member driving.  Discharge instructions reviewed.  Verbalized understanding.  No questions or concerns at this time.  Teach back verified.  Pt in NAD.  No items left in ED.   

## 2016-03-20 NOTE — ED Provider Notes (Signed)
Time Seen: Approximately 2023  I have reviewed the triage notes  Chief Complaint: Wheezing   History of Present Illness: Justin [name] is a 20 y.o. male *who presents with wheezing that started this morning. Patient states he has history of asthma and has been out of his inhaler which normally is effective. Eyes any fever or productive cough. Past Medical History:  Diagnosis Date  . Asthma   . Priapism     There are no active problems to display for this patient.   History reviewed. No pertinent surgical history.  History reviewed. No pertinent surgical history.  Current Outpatient Rx  . Order #: [id number] Class: Print  . Order #: [id number] Class: Print  . Order #: [id number] Class: Print  . Order #: [id number] Class: Print  . Order #: [id number] Class: Print  . Order #: [id number] Class: Print  . Order #: [id number] Class: Print  . Order #: [id number] Class: Print    Allergies:  Review of patient's allergies indicates no known allergies.  Family History: No family history on file.  Social History: Social History  Substance Use Topics  . Smoking status: Current Every Day Smoker    Packs/day: 0.50    Types: Cigarettes  . Smokeless tobacco: Current User  . Alcohol use Yes     Review of Systems:   10 point review of systems was performed and was otherwise negative:  Constitutional: No fever Eyes: No visual disturbances ENT: No sore throat, ear pain Cardiac: No chest pain Respiratory: No shortness of breath, wheezing, or stridor Abdomen: No abdominal pain, no vomiting, No diarrhea Endocrine: No weight loss, No night sweats Extremities: No peripheral edema, cyanosis Skin: No rashes, easy bruising Neurologic: No focal weakness, trouble with speech or swollowing Urologic: No dysuria, Hematuria, or urinary frequency   Physical Exam:  ED Triage Vitals  Enc Vitals Group     BP 03/20/16 1949 (!) 153/93     Pulse Rate 03/20/16 1949 72     Resp 03/20/16 1949 18     Temp  03/20/16 1949 98.5 F (36.9 C)     Temp Source 03/20/16 1949 Oral     SpO2 03/20/16 1949 97 %     Weight 03/20/16 1948 250 lb (113.4 kg)     Height 03/20/16 1948 6\' 1"  (1.854 m)     Head Circumference --      Peak Flow --      Pain Score 03/20/16 1948 0     Pain Loc --      Pain Edu? --      Excl. in GC? --     General: Awake , Alert , and Oriented times 3; GCS 15 Head: Normal cephalic , atraumatic Eyes: Pupils equal , round, reactive to light Nose/Throat: No nasal drainage, patent upper airway without erythema or exudate.  Neck: Supple, Full range of motion, No anterior adenopathy or palpable thyroid masses Lungs: Clear to ascultation without wheezes , rhonchi, or rales Heart: Regular rate, regular rhythm without murmurs , gallops , or rubs Abdomen: Soft, non tender without rebound, guarding , or rigidity; bowel sounds positive and symmetric in all 4 quadrants. No organomegaly .        Extremities: 2 plus symmetric pulses. No edema, clubbing or cyanosis Neurologic: normal ambulation, Motor symmetric without deficits, sensory intact Skin: warm, dry, no rashes    Radiology: "Dg Chest 2 View  Result Date: 03/20/2016 CLINICAL DATA:  Acute onset of wheezing.  Initial encounter. EXAM: CHEST  2 VIEW COMPARISON:  None. FINDINGS:  The lungs are well-aerated and clear. There is no evidence of focal opacification, pleural effusion or pneumothorax. The heart is normal in size; the mediastinal contour is within normal limits. No acute osseous abnormalities are seen. IMPRESSION: No acute cardiopulmonary process seen. Electronically Signed   By: Roanna RaiderJeffery  Chang M.D.   On: 03/20/2016 20:23  "  I personally reviewed the radiologic studies   ED Course:  Patient's stay here was uneventful and I examined him after he received a DuoNeb in the triage area and now feels symptomatic relief and does not have any signs of respiratory distress or wheezing on my exam. You comfortable with outpatient  management prescribed the patient is normal inhaler. Clinical Course     Assessment:  Acute exacerbation of chronic asthma   Final Clinical Impression:  Final diagnoses:  Mild asthma with exacerbation, unspecified whether persistent     Plan:  Outpatient " Discharge Medication List as of 03/20/2016  8:33 PM    "Albuterol inhaler Patient was advised to return immediately if condition worsens. Patient was advised to follow up with their primary care physician or other specialized physicians involved in their outpatient care. The patient and/or family member/power of attorney had laboratory results reviewed at the bedside. All questions and concerns were addressed and appropriate discharge instructions were distributed by the nursing staff.             Jennye MoccasinBrian S Quigley, MD 03/20/16 [phone]

## 2016-03-20 NOTE — Discharge Instructions (Signed)
Please return immediately if condition worsens. Please contact her primary physician or the physician you were given for referral. If you have any specialist physicians involved in her treatment and plan please also contact them. Thank you for using [facility]. ° °

## 2016-03-29 ENCOUNTER — Emergency Department
Admission: EM | Admit: 2016-03-29 | Discharge: 2016-03-29 | Disposition: A | Discharge status: Home / Self Care | Payer: Medicaid Other | Attending: Emergency Medicine | Admitting: Emergency Medicine

## 2016-03-29 ENCOUNTER — Encounter: Payer: Self-pay | Admitting: Emergency Medicine

## 2016-03-29 ENCOUNTER — Emergency Department: Payer: Medicaid Other

## 2016-03-29 DIAGNOSIS — F17210 Nicotine dependence, cigarettes, uncomplicated: Secondary | ICD-10-CM | POA: Insufficient documentation

## 2016-03-29 DIAGNOSIS — R0602 Shortness of breath: Secondary | ICD-10-CM | POA: Diagnosis present

## 2016-03-29 DIAGNOSIS — J9801 Acute bronchospasm: Secondary | ICD-10-CM | POA: Insufficient documentation

## 2016-03-29 MED ORDER — IPRATROPIUM-ALBUTEROL 0.5-2.5 (3) MG/3ML IN SOLN
3.0000 mL | Freq: Once | RESPIRATORY_TRACT | Status: AC
  Administered 2016-03-29: 3 mL via RESPIRATORY_TRACT

## 2016-03-29 MED ORDER — IPRATROPIUM-ALBUTEROL 0.5-2.5 (3) MG/3ML IN SOLN
3.0000 mL | Freq: Once | RESPIRATORY_TRACT | Status: AC
  Administered 2016-03-29: 3 mL via RESPIRATORY_TRACT
  Filled 2016-03-29: qty 3

## 2016-03-29 MED ORDER — ALBUTEROL SULFATE HFA 108 (90 BASE) MCG/ACT IN AERS: 2.0000 | INHALATION_SPRAY | Freq: Four times a day (QID) | RESPIRATORY_TRACT | 0 refills | Status: DC | PRN

## 2016-03-29 MED ORDER — PREDNISONE 20 MG PO TABS: 40.0000 mg | ORAL_TABLET | Freq: Once | ORAL | Status: DC

## 2016-03-29 MED ORDER — PREDNISONE 20 MG PO TABS
60.0000 mg | ORAL_TABLET | Freq: Once | ORAL | Status: AC
  Administered 2016-03-29: 60 mg via ORAL
  Filled 2016-03-29: qty 3

## 2016-03-29 MED ORDER — IPRATROPIUM-ALBUTEROL 0.5-2.5 (3) MG/3ML IN SOLN
RESPIRATORY_TRACT | Status: AC
  Administered 2016-03-29: 3 mL via RESPIRATORY_TRACT
  Filled 2016-03-29: qty 3

## 2016-03-29 MED ORDER — PREDNISONE 10 MG PO TABS: ORAL_TABLET | ORAL | 0 refills | Status: DC

## 2016-03-29 NOTE — ED Provider Notes (Signed)
[facility] Emergency Department Provider Note ____________________________________________   I have reviewed the triage vital signs and the triage nursing note.  HISTORY  Chief Complaint Wheezing and Shortness of Breath   Historian Patient  HPI Justin [name] is a 20 y.o. male with a history of asthma, ran out of [employer], and a few days ago began having wheezing.  Moderate symptoms.  No chest pain.  No fevers, or sputum production.  PCP at [facility].    Past Medical History:  Diagnosis Date  . Asthma   . Priapism     There are no active problems to display for this patient.   History reviewed. No pertinent surgical history.  Prior to Admission medications   Medication Sig Start Date End Date Taking? Authorizing Provider  albuterol (PROVENTIL HFA;VENTOLIN HFA) 108 (90 Base) MCG/ACT inhaler Inhale 2 puffs into the lungs every 6 (six) hours as needed for wheezing or shortness of breath. 03/20/16   Jennye MoccasinBrian S Quigley, MD  albuterol (PROVENTIL HFA;VENTOLIN HFA) 108 (90 Base) MCG/ACT inhaler Inhale 2 puffs into the lungs every 6 (six) hours as needed for wheezing or shortness of breath. 03/29/16   Governor Rooksebecca Dametra Whetsel, MD  predniSONE (DELTASONE) 10 MG tablet Take 50mg  daily for 4 days 03/29/16   Governor Rooksebecca Jackquelyn Sundberg, MD    No Known Allergies  No family history on file.  Social History Social History  Substance Use Topics  . Smoking status: Current Every Day Smoker    Packs/day: 0.50    Types: Cigarettes  . Smokeless tobacco: Current User  . Alcohol use Yes    Review of Systems  Constitutional: Negative for fever. Eyes: Negative for visual changes. ENT: Negative for sore throat. Cardiovascular: Negative for chest pain. Respiratory: Positive for shortness of breath. Gastrointestinal: Negative for abdominal pain, vomiting and diarrhea. Genitourinary: Negative for dysuria. Musculoskeletal: Negative for back pain. Skin: Negative for rash. Neurological:  Negative for headache. 10 point Review of Systems otherwise negative ____________________________________________   PHYSICAL EXAM:  VITAL SIGNS: ED Triage Vitals  Enc Vitals Group     BP 03/29/16 0817 (!) 175/115     Pulse Rate 03/29/16 0817 86     Resp 03/29/16 0817 20     Temp 03/29/16 0817 98 F (36.7 C)     Temp Source 03/29/16 0817 Oral     SpO2 03/29/16 0817 94 %     Weight 03/29/16 0817 250 lb (113.4 kg)     Height 03/29/16 0817 6' (1.829 m)     Head Circumference --      Peak Flow --      Pain Score 03/29/16 1142 0     Pain Loc --      Pain Edu? --      Excl. in GC? --      Constitutional: Alert and oriented. Well appearing and in no distress. HEENT   Head: Normocephalic and atraumatic.      Eyes: Conjunctivae are normal. PERRL. Normal extraocular movements.      Ears:         Nose: No congestion/rhinnorhea.   Mouth/Throat: Mucous membranes are moist.   Neck: No stridor. Cardiovascular/Chest: Normal rate, regular rhythm.  No murmurs, rubs, or gallops. Respiratory: Mild wheezing but normal respiratory effort rest and with walking. No rhonchi. Gastrointestinal: Soft. No distention, no guarding, no rebound. Nontender.    Genitourinary/rectal:Deferred Musculoskeletal: Nontender with normal range of motion in all extremities. No joint effusions.  No lower extremity tenderness.  No  edema. Neurologic:  Normal speech and language. No gross or focal neurologic deficits are appreciated. Skin:  Skin is warm, dry and intact. No rash noted. Psychiatric: Mood and affect are normal. Speech and behavior are normal. Patient exhibits appropriate insight and judgment.   ____________________________________________  LABS (pertinent positives/negatives)  Labs Reviewed - No data to display  ____________________________________________    EKG I, Governor Rooksebecca Marlea Gambill, MD, the attending physician have personally viewed and interpreted all ECGs.  82 bpm normal sinus  rhythm.  Narrow QRS. normal axis. Normal ST and T-wave ____________________________________________  RADIOLOGY All Xrays were viewed by me. Imaging interpreted by Radiologist.  Chest x-ray two-view: No acute cardio pulmonary disease. __________________________________________  PROCEDURES  Procedure(s) performed: None  Critical Care performed: None  ____________________________________________   ED COURSE / ASSESSMENT AND PLAN  Pertinent labs & imaging results that were available during my care of the patient were reviewed by me and considered in my medical decision making (see chart for details).   Nurse asked me and started protocol DuoNeb treatment 2 for patient with a history of asthma and wheezing.  When I listen to the patient, after 2 DuoNeb, much improved and able to talk at rest. No hypoxia at rest or with walking. I will give him a prescription for prednisone burst as well as prescription for albuterol inhaler.    CONSULTATIONS:   None Patient / Family / Caregiver informed of clinical course, medical decision-making process, and agree with plan.   I discussed return precautions, follow-up instructions, and discharge instructions with patient and/or family.   ___________________________________________   FINAL CLINICAL IMPRESSION(S) / ED DIAGNOSES   Final diagnoses:  Bronchospasm              Note: This dictation was prepared with Dragon dictation. Any transcriptional errors that result from this process are unintentional    Governor Rooksebecca Klein Willcox, MD 03/29/16 1202

## 2016-03-29 NOTE — ED Triage Notes (Signed)
C/O wheezing x 1 day.  States has had cough and cold symptoms for about 2 weeks.  Has been using inhaler more frequently than usual and has run out.

## 2016-03-29 NOTE — Discharge Instructions (Signed)
You were evaluated for wheezing and trouble breathing and are being treated for asthma exacerbation also called bronchospasm.  Return to the emergency department any worsening trouble breathing, chest pain, fever, confusion or altered mental status, or any other symptoms concerning to you.

## 2016-04-16 ENCOUNTER — Emergency Department
Admission: EM | Admit: 2016-04-16 | Discharge: 2016-04-16 | Disposition: A | Discharge status: Home / Self Care | Payer: Medicaid Other | Attending: Emergency Medicine | Admitting: Emergency Medicine

## 2016-04-16 ENCOUNTER — Encounter: Payer: Self-pay | Admitting: Emergency Medicine

## 2016-04-16 DIAGNOSIS — J45909 Unspecified asthma, uncomplicated: Secondary | ICD-10-CM | POA: Diagnosis not present

## 2016-04-16 DIAGNOSIS — F17210 Nicotine dependence, cigarettes, uncomplicated: Secondary | ICD-10-CM | POA: Insufficient documentation

## 2016-04-16 DIAGNOSIS — Z79899 Other long term (current) drug therapy: Secondary | ICD-10-CM | POA: Diagnosis not present

## 2016-04-16 DIAGNOSIS — R05 Cough: Secondary | ICD-10-CM | POA: Diagnosis present

## 2016-04-16 DIAGNOSIS — J00 Acute nasopharyngitis [common cold]: Secondary | ICD-10-CM | POA: Diagnosis not present

## 2016-04-16 MED ORDER — PSEUDOEPH-BROMPHEN-DM 30-2-10 MG/5ML PO SYRP: 10.0000 mL | ORAL_SOLUTION | Freq: Four times a day (QID) | ORAL | 0 refills | Status: DC | PRN

## 2016-04-16 NOTE — ED Provider Notes (Signed)
[facility] Emergency Department Provider Note  ____________________________________________  Time seen: Approximately 11:02 AM  I have reviewed the triage vital signs and the nursing notes.   HISTORY  Chief Complaint URI    HPI Justin [name] is a 20 y.o. male , NAD, presents to the emergency department with one-day history of nasal congestion, runny nose or cough. Patient states he initially arrived to the emergency department to visit a family member but states they were not in this emergency department [facility] [place]. States he needs a work note due to his missing work today as he left work to check on this family member. He states he was told that if he did not gain a work note that he would be terminated. He does note again he's had cold symptoms since yesterday but is not taking anything over-the-counter. He does have a history of chronic asthma and when she is well-known to this emergency Department for such. He denies any acute asthma attack or exacerbation at this time. States he plans on follow-up with his primary care provider in regards to his asthma and follow-up. Currently denies any fevers, chills, body aches. No chest pain, shortness of breath, abdominal pain, nausea or vomiting.   Past Medical History:  Diagnosis Date  . Asthma   . Priapism     There are no active problems to display for this patient.   History reviewed. No pertinent surgical history.  Prior to Admission medications   Medication Sig Start Date End Date Taking? Authorizing Provider  albuterol (PROVENTIL HFA;VENTOLIN HFA) 108 (90 Base) MCG/ACT inhaler Inhale 2 puffs into the lungs every 6 (six) hours as needed for wheezing or shortness of breath. 03/20/16   Jennye MoccasinBrian S Quigley, MD  albuterol (PROVENTIL HFA;VENTOLIN HFA) 108 (90 Base) MCG/ACT inhaler Inhale 2 puffs into the lungs every 6 (six) hours as needed for wheezing or shortness of breath.  03/29/16   Governor Rooksebecca Lord, MD  brompheniramine-pseudoephedrine-DM 30-2-10 MG/5ML syrup Take 10 mLs by mouth 4 (four) times daily as needed. 04/16/16   Jonel Weldon L Denisse Whitenack, PA-C  predniSONE (DELTASONE) 10 MG tablet Take 50mg  daily for 4 days 03/29/16   Governor Rooksebecca Lord, MD    Allergies Patient has no known allergies.  No family history on file.  Social History Social History  Substance Use Topics  . Smoking status: Current Every Day Smoker    Packs/day: 0.50    Types: Cigarettes  . Smokeless tobacco: Current User  . Alcohol use Yes     Review of Systems  Constitutional: No fever/chills ENT: Positive nasal congestion, runny nose, sneezing. No sore throat, sinus pressure, ear pain. Cardiovascular: No chest pain. Respiratory: Mild nonproductive cough. No shortness of breath. No wheezing.  Gastrointestinal: No abdominal pain.  No nausea, vomiting. Musculoskeletal: Negative for general myalgias.  Skin: Negative for rash. Neurological: Negative for headaches, focal weakness or numbness. 10-point ROS otherwise negative.  ____________________________________________   PHYSICAL EXAM:  VITAL SIGNS: ED Triage Vitals [04/16/16 1044]  Enc Vitals Group     BP (!) 155/104     Pulse Rate 87     Resp 16     Temp 98.5 F (36.9 C)     Temp Source Oral     SpO2 95 %     Weight      Height      Head Circumference      Peak Flow      Pain Score  Pain Loc      Pain Edu?      Excl. in GC?      Constitutional: Alert and oriented. Well appearing and in no acute distress. Eyes: Conjunctivae are normal Without icterus or injection. Mild clear drainage noted from right eye. Head: Atraumatic. ENT:      Ears: TMs visualized without erythema, effusion, bulging or perforation.      Nose: Trace congestion with clear rhinorrhea.      Mouth/Throat: Mucous membranes are moist. Pharynx with mild injection but no swelling or exudate. Uvula is midline. Airway is patent. Neck: No stridor.  Supple with  full range of motion Hematological/Lymphatic/Immunilogical: No cervical lymphadenopathy. Cardiovascular: Normal rate, regular rhythm. Normal S1 and S2.  Good peripheral circulation. Respiratory: Normal respiratory effort without tachypnea or retractions. Lungs CTAB with breath sounds noted in all lung fields. No wheeze, rhonchi, rales. Neurologic:  Normal speech and language. Normal gait and posture. No gross focal neurologic deficits are appreciated.  Skin:  Skin is warm, dry and intact. No rash noted. Psychiatric: Mood and affect are normal. Speech and behavior are normal. Patient exhibits appropriate insight and judgement.   ____________________________________________   LABS  None ____________________________________________  EKG  None ____________________________________________  RADIOLOGY  None ____________________________________________    PROCEDURES  Procedure(s) performed: None   Procedures   Medications - No data to display   ____________________________________________   INITIAL IMPRESSION / ASSESSMENT AND PLAN / ED COURSE  Pertinent labs & imaging results that were available during my care of the patient were reviewed by me and considered in my medical decision making (see chart for details).  Clinical Course     Patient's diagnosis is consistent with acute nasopharyngitis. Patient will be discharged home with prescriptions for Bromfed-DM to take as directed. Patient is highly advised to follow-up with his primary care provider at [facility] for further management of asthma. Patient was also given a work note stating he could return to work today without restrictions. Patient is given ED precautions to return to the ED for any worsening or new symptoms.    ____________________________________________  FINAL CLINICAL IMPRESSION(S) / ED DIAGNOSES  Final diagnoses:  Acute nasopharyngitis (common cold)      NEW MEDICATIONS STARTED  DURING THIS VISIT:  Discharge Medication List as of 04/16/2016 11:04 AM    START taking these medications   Details  brompheniramine-pseudoephedrine-DM 30-2-10 MG/5ML syrup Take 10 mLs by mouth 4 (four) times daily as needed., Starting Thu 04/16/2016, Print             Hope PigeonJami L Blaze Nylund, PA-C 04/16/16 1124    Jene Everyobert Kinner, MD 04/16/16 1536

## 2016-06-29 ENCOUNTER — Emergency Department
Admission: EM | Admit: 2016-06-29 | Discharge: 2016-06-29 | Disposition: A | Discharge status: Home / Self Care | Payer: Medicaid Other | Attending: Emergency Medicine | Admitting: Emergency Medicine

## 2016-06-29 ENCOUNTER — Encounter: Payer: Self-pay | Admitting: Emergency Medicine

## 2016-06-29 DIAGNOSIS — Z79899 Other long term (current) drug therapy: Secondary | ICD-10-CM | POA: Insufficient documentation

## 2016-06-29 DIAGNOSIS — J45909 Unspecified asthma, uncomplicated: Secondary | ICD-10-CM | POA: Insufficient documentation

## 2016-06-29 DIAGNOSIS — J069 Acute upper respiratory infection, unspecified: Secondary | ICD-10-CM | POA: Insufficient documentation

## 2016-06-29 DIAGNOSIS — R05 Cough: Secondary | ICD-10-CM | POA: Diagnosis present

## 2016-06-29 DIAGNOSIS — R0789 Other chest pain: Secondary | ICD-10-CM | POA: Insufficient documentation

## 2016-06-29 DIAGNOSIS — F17210 Nicotine dependence, cigarettes, uncomplicated: Secondary | ICD-10-CM | POA: Diagnosis not present

## 2016-06-29 LAB — CBC WITH DIFFERENTIAL/PLATELET
BASOS ABS: 0.1 10*3/uL (ref 0–0.1)
Basophils Relative: 1 %
Eosinophils Absolute: 0.4 10*3/uL (ref 0–0.7)
Eosinophils Relative: 2 %
HEMATOCRIT: 49.1 % (ref 40.0–52.0)
Hemoglobin: 16.7 g/dL (ref 13.0–18.0)
LYMPHS ABS: 2 10*3/uL (ref 1.0–3.6)
LYMPHS PCT: 14 %
MCH: 29.6 pg (ref 26.0–34.0)
MCHC: 34 g/dL (ref 32.0–36.0)
MCV: 87.3 fL (ref 80.0–100.0)
MONO ABS: 1.3 10*3/uL — AB (ref 0.2–1.0)
Monocytes Relative: 9 %
NEUTROS ABS: 11.1 10*3/uL — AB (ref 1.4–6.5)
Neutrophils Relative %: 74 %
Platelets: 244 10*3/uL (ref 150–440)
RBC: 5.62 MIL/uL (ref 4.40–5.90)
RDW: 12.8 % (ref 11.5–14.5)
WBC: 14.9 10*3/uL — AB (ref 3.8–10.6)

## 2016-06-29 LAB — COMPREHENSIVE METABOLIC PANEL
ALBUMIN: 4 g/dL (ref 3.5–5.0)
ALT: 29 U/L (ref 17–63)
ANION GAP: 5 (ref 5–15)
AST: 21 U/L (ref 15–41)
Alkaline Phosphatase: 76 U/L (ref 38–126)
BILIRUBIN TOTAL: 0.9 mg/dL (ref 0.3–1.2)
BUN: 12 mg/dL (ref 6–20)
CHLORIDE: 101 mmol/L (ref 101–111)
CO2: 30 mmol/L (ref 22–32)
Calcium: 8.8 mg/dL — ABNORMAL LOW (ref 8.9–10.3)
Creatinine, Ser: 0.95 mg/dL (ref 0.61–1.24)
GFR calc Af Amer: >60 mL/min (ref >60)
GFR calc non Af Amer: >60 mL/min (ref >60)
GLUCOSE: 98 mg/dL (ref 65–99)
POTASSIUM: 4.1 mmol/L (ref 3.5–5.1)
Sodium: 136 mmol/L (ref 135–145)
TOTAL PROTEIN: 7.7 g/dL (ref 6.5–8.1)

## 2016-06-29 LAB — INFLUENZA PANEL BY PCR (TYPE A & B)
INFLAPCR: NEGATIVE
INFLBPCR: NEGATIVE

## 2016-06-29 NOTE — ED Provider Notes (Signed)
[facility] Emergency Department Provider Note   ____________________________________________   I have reviewed the triage vital signs and the nursing notes.   HISTORY  Chief Complaint URI   History limited by: Not Limited   HPI CALEB DECOCK is a 21 y.o. male who presents to the emergency department today because he is concerned he might have the flu. He states that yesterday evening he began having nasal congestion, cough. He states that he is having some mucus with the cough, no blood. He denies any fevers. He tried taking some alka seltzer without any relief. No significant chest pain, just mild discomfort with congestion.    Past Medical History:  Diagnosis Date  . Asthma   . Priapism     There are no active problems to display for this patient.   History reviewed. No pertinent surgical history.  Prior to Admission medications   Medication Sig Start Date End Date Taking? Authorizing Provider  albuterol (PROVENTIL HFA;VENTOLIN HFA) 108 (90 Base) MCG/ACT inhaler Inhale 2 puffs into the lungs every 6 (six) hours as needed for wheezing or shortness of breath. 03/20/16   Jennye Moccasin, MD  albuterol (PROVENTIL HFA;VENTOLIN HFA) 108 (90 Base) MCG/ACT inhaler Inhale 2 puffs into the lungs every 6 (six) hours as needed for wheezing or shortness of breath. 03/29/16   Governor Rooks, MD  brompheniramine-pseudoephedrine-DM 30-2-10 MG/5ML syrup Take 10 mLs by mouth 4 (four) times daily as needed. 04/16/16   Jami L Hagler, PA-C  predniSONE (DELTASONE) 10 MG tablet Take 50mg  daily for 4 days 03/29/16   Governor Rooks, MD    Allergies Patient has no known allergies.  No family history on file.  Social History Social History  Substance Use Topics  . Smoking status: Current Every Day Smoker    Packs/day: 0.50    Types: Cigarettes  . Smokeless tobacco: Current User  . Alcohol use Yes    Review of Systems  Constitutional: Negative for  fever. Cardiovascular: Positive for chest discomfort. Respiratory: Positive for cough. Gastrointestinal: Negative for abdominal pain, vomiting and diarrhea. Neurological: Negative for headaches, focal weakness or numbness.  10-point ROS otherwise negative.  ____________________________________________   PHYSICAL EXAM:  VITAL SIGNS: ED Triage Vitals  Enc Vitals Group     BP 06/29/16 1216 (!) 173/110     Pulse Rate 06/29/16 1216 89     Resp --      Temp 06/29/16 1216 99.4 F (37.4 C)     Temp Source 06/29/16 1216 Oral     SpO2 06/29/16 1216 96 %     Weight 06/29/16 1219 250 lb (113.4 kg)     Height 06/29/16 1219 6\' 2"  (1.88 m)     Head Circumference --      Peak Flow --      Pain Score 06/29/16 1217 8    Constitutional: Alert and oriented. Well appearing and in no distress. Eyes: Conjunctivae are normal. Normal extraocular movements. ENT   Head: Normocephalic and atraumatic.   Nose: No congestion/rhinnorhea.   Mouth/Throat: Mucous membranes are moist.   Neck: No stridor. Hematological/Lymphatic/Immunilogical: No cervical lymphadenopathy. Cardiovascular: Normal rate, regular rhythm.  No murmurs, rubs, or gallops.  Respiratory: Normal respiratory effort without tachypnea nor retractions. Diffuse expiratory wheezing. Gastrointestinal: Soft and non tender. No rebound. No guarding.  Genitourinary: Deferred Musculoskeletal: Normal range of motion in all extremities. No lower extremity edema. Neurologic:  Normal speech and language. No gross focal neurologic deficits are appreciated.  Skin:  Skin is  warm, dry and intact. No rash noted. Psychiatric: Mood and affect are normal. Speech and behavior are normal. Patient exhibits appropriate insight and judgment.  ____________________________________________    LABS (pertinent positives/negatives)  Labs Reviewed  COMPREHENSIVE METABOLIC PANEL - Abnormal; Notable for the following:       Result Value   Calcium 8.8  (*)    All other components within normal limits  CBC WITH DIFFERENTIAL/PLATELET - Abnormal; Notable for the following:    WBC 14.9 (*)    Neutro Abs 11.1 (*)    Monocytes Absolute 1.3 (*)    All other components within normal limits  INFLUENZA PANEL BY PCR (TYPE A & B)     ____________________________________________   EKG  None  ____________________________________________    RADIOLOGY  None  ____________________________________________   PROCEDURES  Procedures  ____________________________________________   INITIAL IMPRESSION / ASSESSMENT AND PLAN / ED COURSE  Pertinent labs & imaging results that were available during my care of the patient were reviewed by me and considered in my medical decision making (see chart for details).  Patient presents to the emergency department today because of his concern that he might have the flu with upper respiratory infection symptoms. Patient's flu swab was negative. Mild leukocytosis. Patient does not have any fevers. No significant chest pain. On exam the patient does have some diffuse wheezing. He does have a history of asthma. I did offer to treatment with DuoNeb treatments here in the emergency department however patient declined. This point I think upper respiratory infection unlikely. I have low suspicion for pneumonia patient is comfortable deferring x-rays at this time. Did discuss return precautions. Patient will follow-up with primary care.  ____________________________________________   FINAL CLINICAL IMPRESSION(S) / ED DIAGNOSES  Final diagnoses:  Upper respiratory tract infection, unspecified type     Note: This dictation was prepared with Dragon dictation. Any transcriptional errors that result from this process are unintentional     Phineas SemenGraydon Phinneas Shakoor, MD 06/29/16 [phone]

## 2016-06-29 NOTE — ED Notes (Addendum)
Patient was to go to flex originally, but became mildly diaphoretic. States he is sweaty at times, but not to this extent. Patient's BP remains high. Moved back to higher acuity.

## 2016-06-29 NOTE — Discharge Instructions (Signed)
Please seek medical attention for any high fevers, chest pain, shortness of breath, change in behavior, persistent vomiting, bloody stool or any other new or concerning symptoms.  

## 2016-06-29 NOTE — ED Triage Notes (Addendum)
Patient reports having sore throat, cough, headache, and nasal congestion. Unsure of fever. When asked if he has h/o HTN, states "yeah, kind of".

## 2017-01-09 IMAGING — CR DG CHEST 2V
2 series · 2 of 2 positions shown · non-contrast
Comparison: None.

CLINICAL DATA: Acute onset of wheezing.  Initial encounter.

EXAM:
CHEST  2 VIEW

[chest pa]
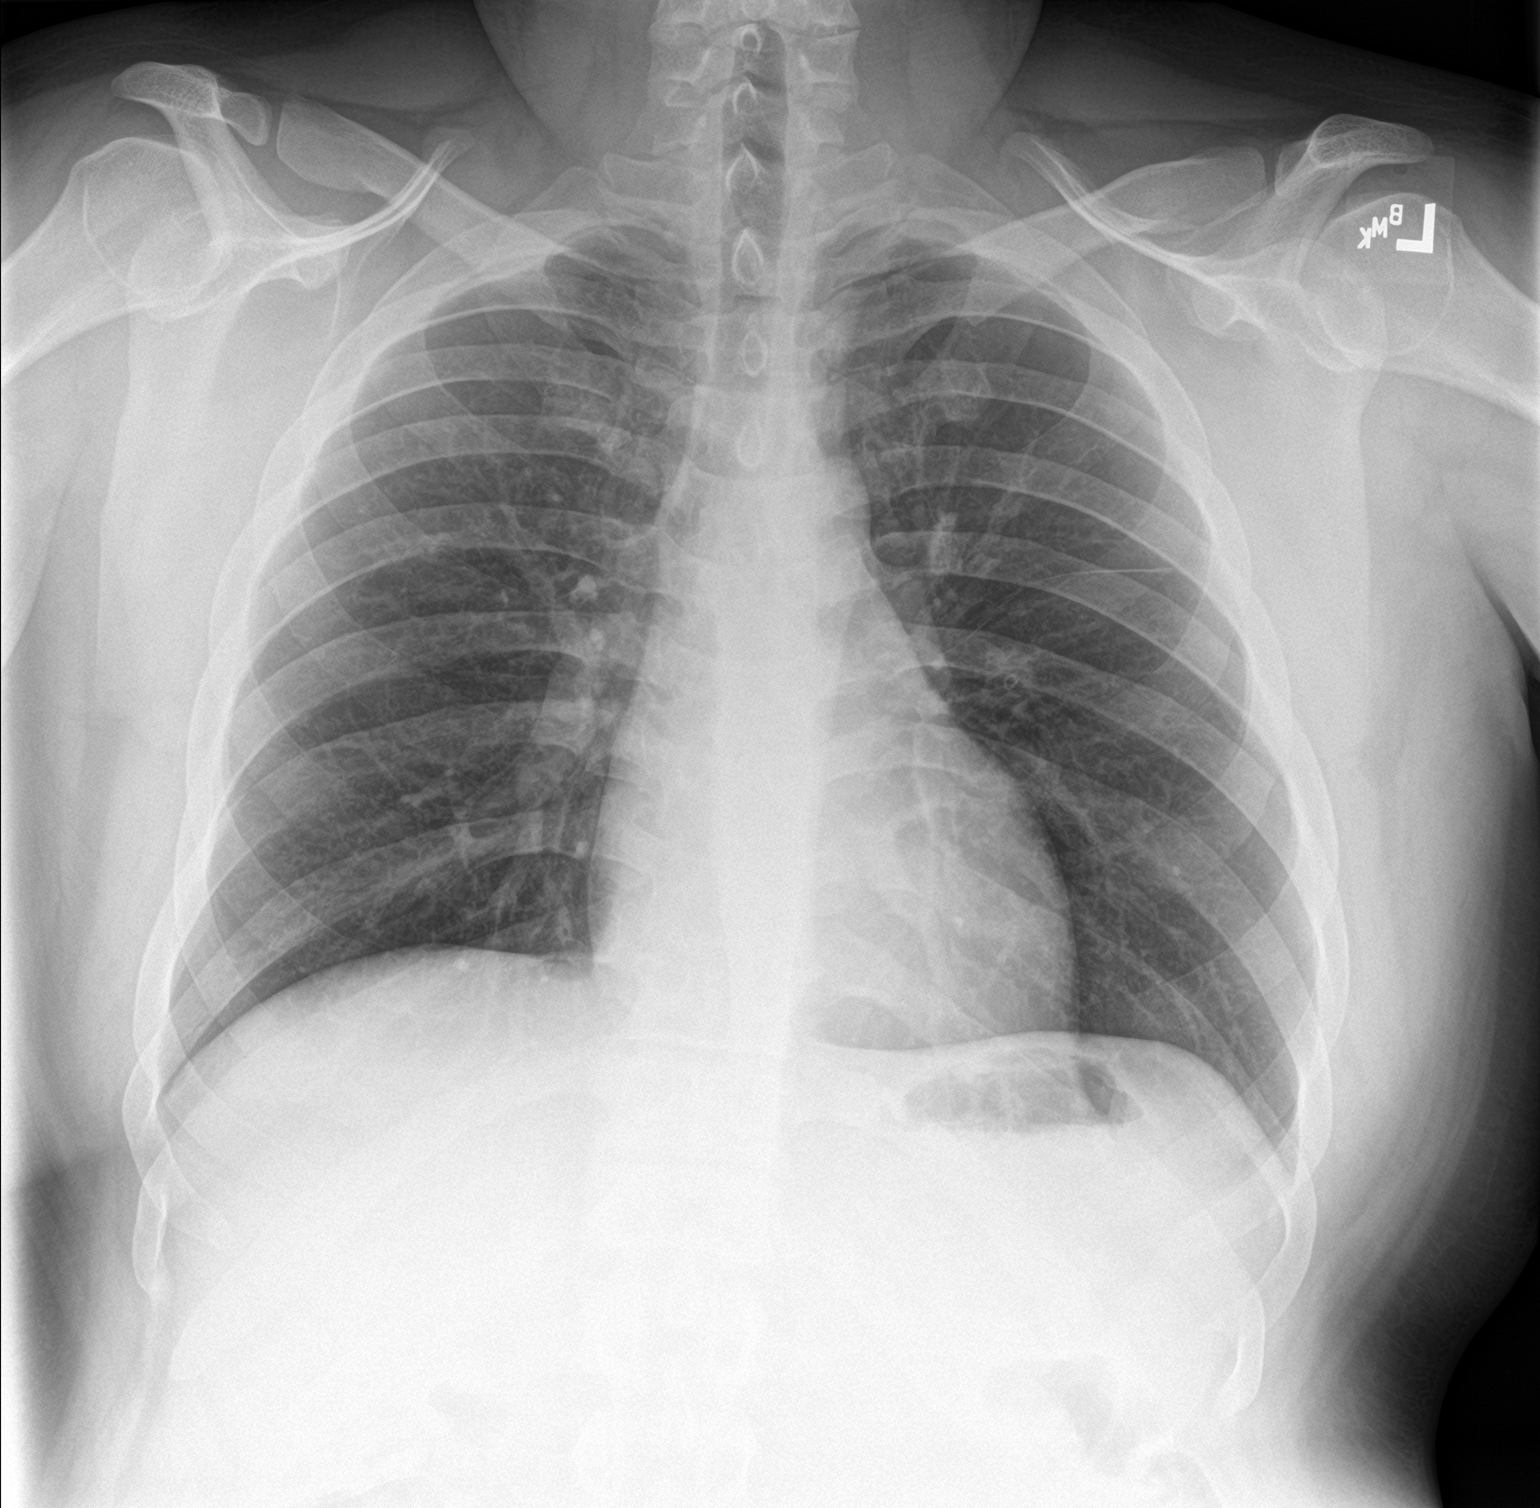

[chest lat]
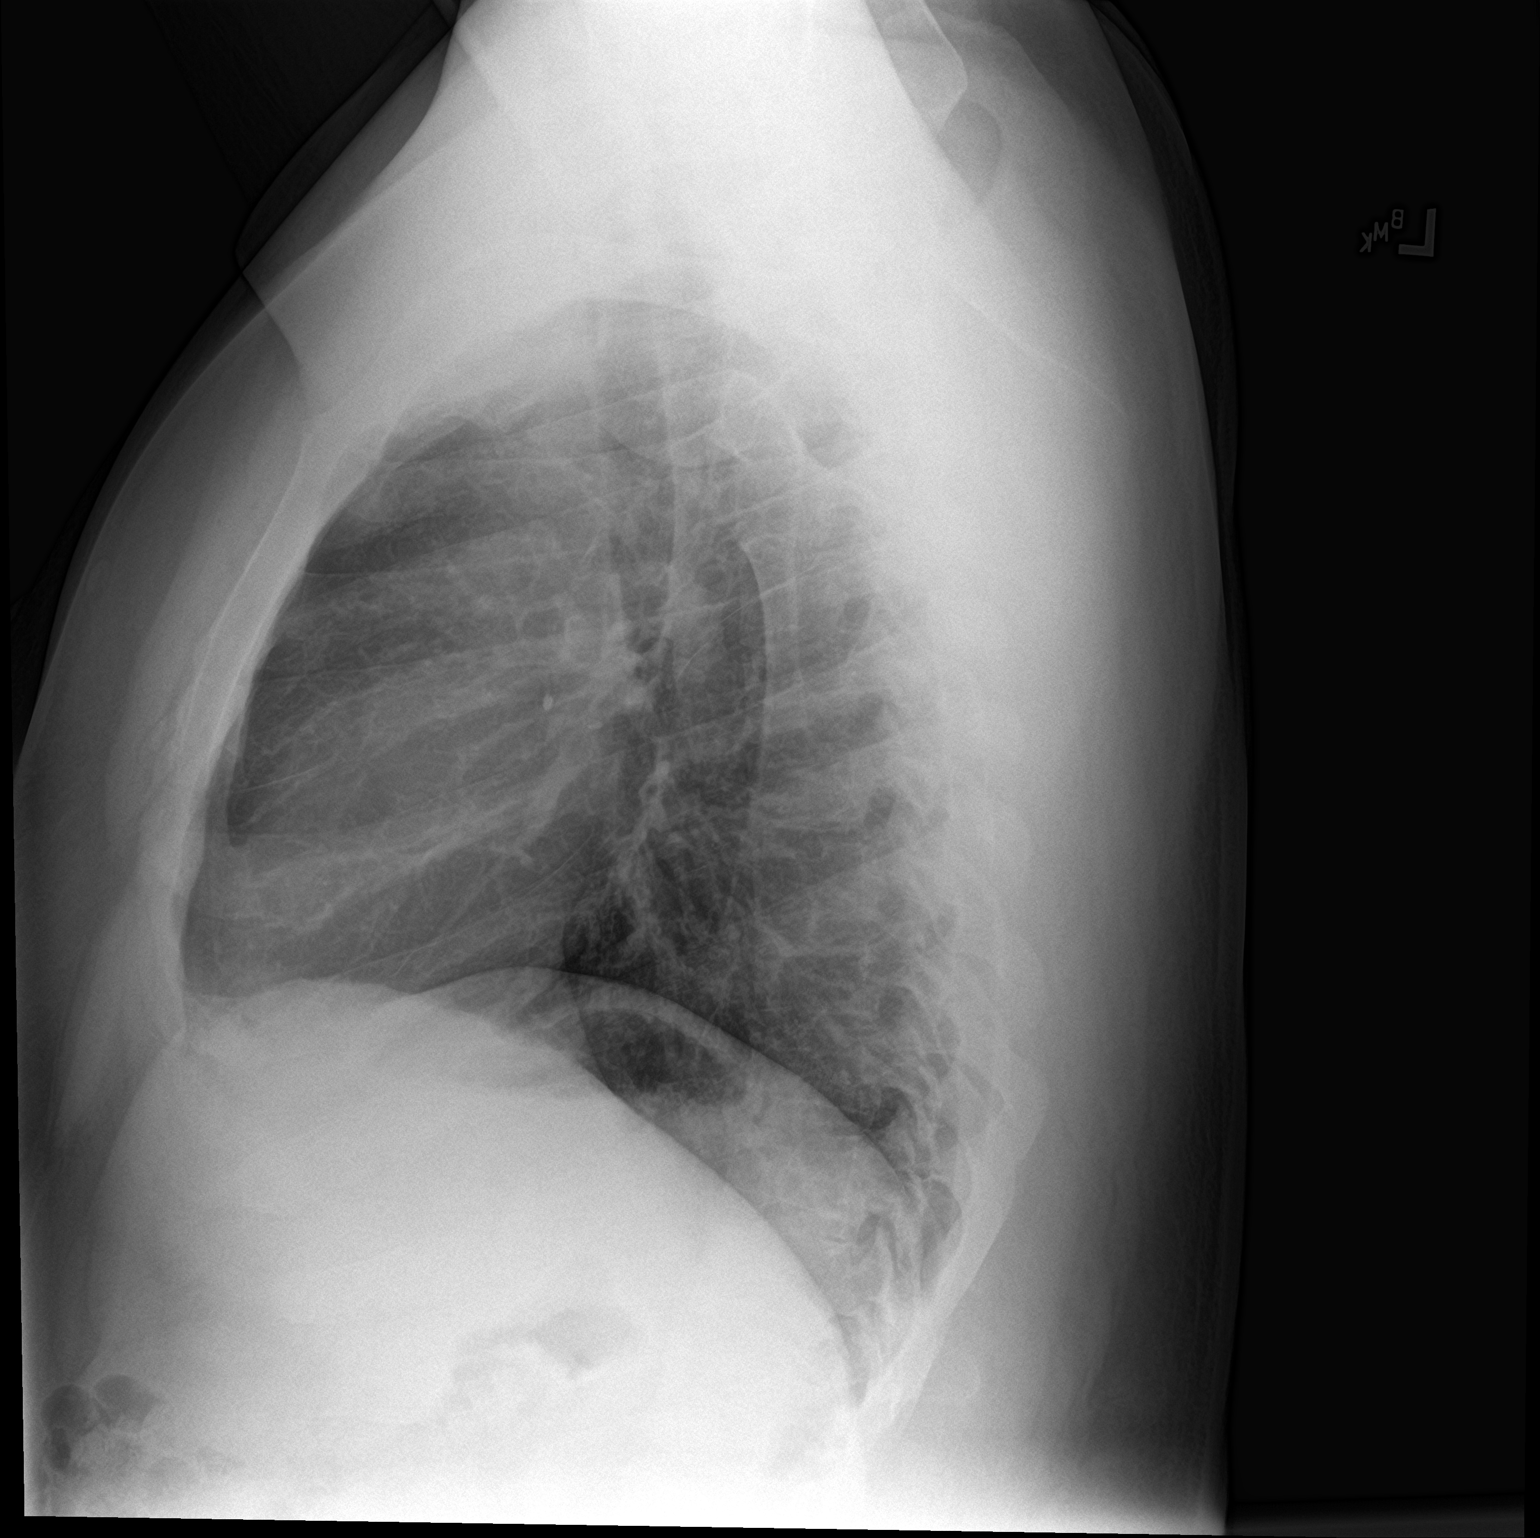

[2 of 2 positions shown; findings below may reference images not displayed]

FINDINGS: The lungs are well-aerated and clear. There is no evidence of focal
opacification, pleural effusion or pneumothorax.

The heart is normal in size; the mediastinal contour is within
normal limits. No acute osseous abnormalities are seen.
IMPRESSION: No acute cardiopulmonary process seen.

## 2017-05-07 ENCOUNTER — Emergency Department
Admission: EM | Admit: 2017-05-07 | Discharge: 2017-05-07 | Disposition: A | Discharge status: Home / Self Care | Payer: Medicaid Other | Attending: Emergency Medicine | Admitting: Emergency Medicine

## 2017-05-07 DIAGNOSIS — Z5321 Procedure and treatment not carried out due to patient leaving prior to being seen by health care provider: Secondary | ICD-10-CM | POA: Insufficient documentation

## 2017-05-07 DIAGNOSIS — J029 Acute pharyngitis, unspecified: Secondary | ICD-10-CM | POA: Diagnosis not present

## 2017-05-07 LAB — POCT RAPID STREP A: STREPTOCOCCUS, GROUP A SCREEN (DIRECT): NEGATIVE

## 2017-05-07 NOTE — ED Triage Notes (Signed)
Patient reports 1 emesis yesterday at work. Patient c/o sore throat.

## 2017-07-09 ENCOUNTER — Other Ambulatory Visit: Payer: Self-pay

## 2017-07-09 ENCOUNTER — Emergency Department
Admission: EM | Admit: 2017-07-09 | Discharge: 2017-07-09 | Disposition: A | Discharge status: Home / Self Care | Payer: Medicaid Other | Attending: Emergency Medicine | Admitting: Emergency Medicine

## 2017-07-09 DIAGNOSIS — L02412 Cutaneous abscess of left axilla: Secondary | ICD-10-CM | POA: Diagnosis not present

## 2017-07-09 DIAGNOSIS — R600 Localized edema: Secondary | ICD-10-CM | POA: Diagnosis present

## 2017-07-09 DIAGNOSIS — J45909 Unspecified asthma, uncomplicated: Secondary | ICD-10-CM | POA: Diagnosis not present

## 2017-07-09 DIAGNOSIS — Z79899 Other long term (current) drug therapy: Secondary | ICD-10-CM | POA: Insufficient documentation

## 2017-07-09 DIAGNOSIS — F17210 Nicotine dependence, cigarettes, uncomplicated: Secondary | ICD-10-CM | POA: Diagnosis not present

## 2017-07-09 MED ORDER — LIDOCAINE HCL (PF) 1 % IJ SOLN
INTRAMUSCULAR | Status: AC
  Administered 2017-07-09: 5 mL
  Filled 2017-07-09: qty 5

## 2017-07-09 MED ORDER — SULFAMETHOXAZOLE-TRIMETHOPRIM 800-160 MG PO TABS
1.0000 | ORAL_TABLET | Freq: Once | ORAL | Status: AC
  Administered 2017-07-09: 1 via ORAL
  Filled 2017-07-09: qty 1

## 2017-07-09 MED ORDER — SULFAMETHOXAZOLE-TRIMETHOPRIM 800-160 MG PO TABS: 1.0000 | ORAL_TABLET | Freq: Two times a day (BID) | ORAL | 0 refills | Status: DC

## 2017-07-09 MED ORDER — OXYCODONE-ACETAMINOPHEN 5-325 MG PO TABS
1.0000 | ORAL_TABLET | Freq: Once | ORAL | Status: AC
  Administered 2017-07-09: 1 via ORAL
  Filled 2017-07-09: qty 1

## 2017-07-09 MED ORDER — HYDROCODONE-ACETAMINOPHEN 5-325 MG PO TABS: 1.0000 | ORAL_TABLET | Freq: Four times a day (QID) | ORAL | 0 refills | Status: DC | PRN

## 2017-07-09 MED ORDER — LIDOCAINE HCL (PF) 1 % IJ SOLN
5.0000 mL | Freq: Once | INTRAMUSCULAR | Status: AC
  Administered 2017-07-09: 5 mL
  Filled 2017-07-09: qty 5

## 2017-07-09 MED ORDER — LIDOCAINE HCL (PF) 1 % IJ SOLN
5.0000 mL | Freq: Once | INTRAMUSCULAR | Status: AC
  Administered 2017-07-09: 5 mL

## 2017-07-09 NOTE — Discharge Instructions (Signed)
Keep the area clean, dry, and covered. Take the antibiotic as directed, and the pain medicine as needed.  Apply warm compresses over the dressing to promote healing. See your provider in 3 day for packing removal.

## 2017-07-09 NOTE — ED Notes (Signed)
See triage note   Presents with possible abscess area under left arm

## 2017-07-09 NOTE — ED Triage Notes (Signed)
Pt came to ED via pov c/o abscess under left arm. Reports has been there for a week and a half.

## 2017-07-09 NOTE — ED Provider Notes (Signed)
[facility] Emergency Department Provider Note ____________________________________________  Time seen: 1155  I have reviewed the triage vital signs and the nursing notes.  HISTORY  Chief Complaint  Abscess   HPI Justin [name] is a 22 y.o. male resents to the ED for evaluation management of a tender, firm, and abscess to the left armpit.  Patient reports there is been developing over the last week.  He presents now noting a small area of pus and pointing and that is scant for the last 2 days.  He denies any interim fevers, chills, sweats.  He denies any previous abscesses requiring I&D procedure.  Past Medical History:  Diagnosis Date  . Asthma   . Priapism     There are no active problems to display for this patient.   History reviewed. No pertinent surgical history.  Prior to Admission medications   Medication Sig Start Date End Date Taking? Authorizing Provider  albuterol (PROVENTIL HFA;VENTOLIN HFA) 108 (90 Base) MCG/ACT inhaler Inhale 2 puffs into the lungs every 6 (six) hours as needed for wheezing or shortness of breath. 03/20/16   Jennye Moccasin, MD  albuterol (PROVENTIL HFA;VENTOLIN HFA) 108 (90 Base) MCG/ACT inhaler Inhale 2 puffs into the lungs every 6 (six) hours as needed for wheezing or shortness of breath. 03/29/16   Governor Rooks, MD  brompheniramine-pseudoephedrine-DM 30-2-10 MG/5ML syrup Take 10 mLs by mouth 4 (four) times daily as needed. 04/16/16   Hagler, Jami L, PA-C  HYDROcodone-acetaminophen (NORCO) 5-325 MG tablet Take 1 tablet by mouth every 6 (six) hours as needed. 07/09/17   Tildon Silveria, Charlesetta Ivory, PA-C  predniSONE (DELTASONE) 10 MG tablet Take 50mg  daily for 4 days 03/29/16   Governor Rooks, MD  sulfamethoxazole-trimethoprim (BACTRIM DS,SEPTRA DS) 800-160 MG tablet Take 1 tablet by mouth 2 (two) times daily. 07/09/17   Kourtnei Rauber, Charlesetta Ivory, PA-C    Allergies Patient has no known allergies.  No family history on  file.  Social History Social History   Tobacco Use  . Smoking status: Current Every Day Smoker    Packs/day: 0.50    Types: Cigarettes  . Smokeless tobacco: Current User  Substance Use Topics  . Alcohol use: Yes  . Drug use: No    Review of Systems  Constitutional: Negative for fever. Cardiovascular: Negative for chest pain. Respiratory: Negative for shortness of breath. Musculoskeletal: Negative for back pain. Skin: Negative for rash.  Left axilla abscess as above. Neurological: Negative for headaches, focal weakness or numbness. ____________________________________________  PHYSICAL EXAM:  VITAL SIGNS: ED Triage Vitals  Enc Vitals Group     BP 07/09/17 1053 (!) 156/88     Pulse Rate 07/09/17 1053 (!) 103     Resp 07/09/17 1053 20     Temp 07/09/17 1053 98.8 F (37.1 C)     Temp Source 07/09/17 1053 Oral     SpO2 07/09/17 1053 95 %     Weight 07/09/17 1053 264 lb (119.7 kg)     Height 07/09/17 1053 6\' 1"  (1.854 m)     Head Circumference --      Peak Flow --      Pain Score 07/09/17 1357 7     Pain Loc --      Pain Edu? --      Excl. in GC? --     Constitutional: Alert and oriented. Well appearing and in no distress. Head: Normocephalic and atraumatic. Cardiovascular: Normal rate, regular rhythm. Normal distal pulses. Respiratory: Normal respiratory effort.  No wheezes/rales/rhonchi. Musculoskeletal: Nontender with normal range of motion in all extremities.  Neurologic:  Normal gait without ataxia. Normal speech and language. No gross focal neurologic deficits are appreciated. Skin:  Skin is warm, dry and intact. No rash noted.  Patient with a large area of induration and erythema to the left axilla.  There is a central area of pointing, fluctuance, and subcutaneous purulence noted. ____________________________________________  PROCEDURES  .Marland Kitchen.Incision and Drainage Date/Time: 07/09/2017 2:05 PM Performed by: Lissa HoardMenshew, Abass Misener V Bacon, PA-C Authorized by: Lissa HoardMenshew,  Bran Aldridge V Bacon, PA-C   Consent:    Consent obtained:  Verbal   Consent given by:  Patient   Risks discussed:  Infection Location:    Type:  Abscess   Location:  Upper extremity Pre-procedure details:    Skin preparation:  Betadine Anesthesia (see MAR for exact dosages):    Anesthesia method:  Local infiltration   Local anesthetic:  Lidocaine 1% w/o epi Procedure type:    Complexity:  Simple Procedure details:    Needle aspiration: no     Incision types:  Single straight   Incision depth:  Subcutaneous   Scalpel blade:  11   Wound management:  Probed and deloculated and irrigated with saline   Drainage:  Purulent   Drainage amount:  Copious   Wound treatment:  Drain placed   Packing materials:  1/2 in iodoform gauze   Amount 1/2" iodoform:  6 inches Post-procedure details:    Patient tolerance of procedure:  Tolerated well, no immediate complications  ____________________________________________  INITIAL IMPRESSION / ASSESSMENT AND PLAN / ED COURSE  She with ED evaluation of a left axilla abscess.  Patient presents with a pointing fluctuant abscess to the left axilla that is drained and packed following I&D procedure.  Patient tolerated procedure well.  He is discharged with prescription for Bactrim and hydrocodone for pain relief.  He will follow-up with his PCP in 3 days for packing removal and wound check.  Return precautions have been reviewed.  I reviewed the patient's prescription history over the last 12 months in the multi-state controlled substances database(s) that includes [place], [place], [place], [place], [place], [place], [place], [place], [place], [place], [place] , [place], [place], and [place].  Results were notable for no recent narcotic prescriptions.  ____________________________________________  FINAL CLINICAL IMPRESSION(S) / ED DIAGNOSES  Final diagnoses:  Abscess of left axilla      Karmen StabsMenshew, Charlesetta IvoryJenise V Bacon,  PA-C 07/09/17 1409    Arnaldo NatalMalinda, Paul F, MD 07/09/17 [phone]

## 2017-07-10 ENCOUNTER — Encounter: Payer: Self-pay | Admitting: Emergency Medicine

## 2017-07-10 DIAGNOSIS — T7840XA Allergy, unspecified, initial encounter: Secondary | ICD-10-CM | POA: Insufficient documentation

## 2017-07-10 DIAGNOSIS — J45909 Unspecified asthma, uncomplicated: Secondary | ICD-10-CM | POA: Insufficient documentation

## 2017-07-10 DIAGNOSIS — F17210 Nicotine dependence, cigarettes, uncomplicated: Secondary | ICD-10-CM | POA: Diagnosis not present

## 2017-07-10 DIAGNOSIS — Z79899 Other long term (current) drug therapy: Secondary | ICD-10-CM | POA: Insufficient documentation

## 2017-07-10 NOTE — ED Notes (Signed)
Up to stat desk asking about how much longer, explained awaiting treatment room.

## 2017-07-10 NOTE — ED Notes (Signed)
Patient reports thinks having allergic reaction to medication he started yesterday.  Patient reports his hands feel tingling and like they are swelling.  Patient denies any type of respiratory problems.  Patient with no acute respiratory distress noted, handling own secretions without difficulty.

## 2017-07-10 NOTE — ED Triage Notes (Signed)
Patient state that he was seen here yesterday for an abscess. Patient states that he started taking the antibiotic and pain medication today. Patient reports that about an hour ago he started having some swelling around his eyes and in his hands. Patient speaking in complete sentences with no difficulty breathing.

## 2017-07-11 ENCOUNTER — Emergency Department
Admission: EM | Admit: 2017-07-11 | Discharge: 2017-07-11 | Disposition: A | Discharge status: Home / Self Care | Payer: Medicaid Other | Attending: Emergency Medicine | Admitting: Emergency Medicine

## 2017-07-11 DIAGNOSIS — T7840XA Allergy, unspecified, initial encounter: Secondary | ICD-10-CM

## 2017-07-11 MED ORDER — CEPHALEXIN 500 MG PO CAPS: 500.0000 mg | ORAL_CAPSULE | Freq: Two times a day (BID) | ORAL | 0 refills | Status: AC

## 2017-07-11 MED ORDER — CEPHALEXIN 500 MG PO CAPS
500.0000 mg | ORAL_CAPSULE | Freq: Once | ORAL | Status: AC
  Administered 2017-07-11: 500 mg via ORAL
  Filled 2017-07-11: qty 1

## 2017-07-11 NOTE — ED Provider Notes (Signed)
[facility] Emergency Department Provider Note   First MD Initiated Contact with Patient 07/11/17 0106     (approximate)  I have reviewed the triage vital signs and the nursing notes.   HISTORY  Chief Complaint Allergic Reaction    HPI Justin [name] is a 22 y.o. male with below list of chronic medical conditions recent admission to the emergency department for left axilla abscess prescribed Bactrim and hydrocodone return to the emergency department with history of pruritic rash which began on his face tonight accompanied by periorbital swelling.  Patient denied any difficulty breathing or swallowing.  Patient states that symptoms began after taking dose of Bactrim.  Patient states that rash has resolved since onset.  Past Medical History:  Diagnosis Date  . Asthma   . Priapism     There are no active problems to display for this patient.   History reviewed. No pertinent surgical history.  Prior to Admission medications   Medication Sig Start Date End Date Taking? Authorizing Provider  albuterol (PROVENTIL HFA;VENTOLIN HFA) 108 (90 Base) MCG/ACT inhaler Inhale 2 puffs into the lungs every 6 (six) hours as needed for wheezing or shortness of breath. 03/20/16   Jennye MoccasinQuigley, Brian S, MD  albuterol (PROVENTIL HFA;VENTOLIN HFA) 108 (90 Base) MCG/ACT inhaler Inhale 2 puffs into the lungs every 6 (six) hours as needed for wheezing or shortness of breath. 03/29/16   Governor RooksLord, Rebecca, MD  brompheniramine-pseudoephedrine-DM 30-2-10 MG/5ML syrup Take 10 mLs by mouth 4 (four) times daily as needed. 04/16/16   Hagler, Jami L, PA-C  HYDROcodone-acetaminophen (NORCO) 5-325 MG tablet Take 1 tablet by mouth every 6 (six) hours as needed. 07/09/17   Menshew, Charlesetta IvoryJenise V Bacon, PA-C  predniSONE (DELTASONE) 10 MG tablet Take 50mg  daily for 4 days 03/29/16   Governor RooksLord, Rebecca, MD  sulfamethoxazole-trimethoprim (BACTRIM DS,SEPTRA DS) 800-160 MG tablet Take 1 tablet by mouth 2 (two) times  daily. 07/09/17   Menshew, Charlesetta IvoryJenise V Bacon, PA-C    Allergies No known drug allergies No family history on file.  Social History Social History   Tobacco Use  . Smoking status: Current Every Day Smoker    Packs/day: 0.50    Types: Cigarettes  . Smokeless tobacco: Never Used  Substance Use Topics  . Alcohol use: Yes  . Drug use: No    Review of Systems Constitutional: No fever/chills Eyes: No visual changes. ENT: No sore throat. Cardiovascular: Denies chest pain. Respiratory: Denies shortness of breath. Gastrointestinal: No abdominal pain.  No nausea, no vomiting.  No diarrhea.  No constipation. Genitourinary: Negative for dysuria. Musculoskeletal: Negative for neck pain.  Negative for back pain. Integumentary: Positive for rash. Neurological: Negative for headaches, focal weakness or numbness.  ____________________________________________   PHYSICAL EXAM:  VITAL SIGNS: ED Triage Vitals [07/10/17 2112]  Enc Vitals Group     BP (!) 165/101     Pulse Rate 76     Resp 18     Temp 98.5 F (36.9 C)     Temp Source Oral     SpO2 100 %     Weight 117.9 kg (260 lb)     Height 1.854 m (6\' 1" )     Head Circumference      Peak Flow      Pain Score      Pain Loc      Pain Edu?      Excl. in GC?     Constitutional: Alert and oriented. Well appearing and in no acute  distress. Eyes: Conjunctivae are normal.  Suborbital edema Head: Atraumatic. Mouth/Throat: Mucous membranes are moist.  Oropharynx non-erythematous. Neck: No stridor.  Cardiovascular: Normal rate, regular rhythm. Good peripheral circulation. Grossly normal heart sounds. Respiratory: Normal respiratory effort.  No retractions. Lungs CTAB. Gastrointestinal: Soft and nontender. No distention.  Musculoskeletal: No lower extremity tenderness nor edema. No gross deformities of extremities. Neurologic:  Normal speech and language. No gross focal neurologic deficits are appreciated.  Skin:  Skin is warm, dry and  intact. No rash noted.  Left axilla wound with packing still in place. Psychiatric: Mood and affect are normal. Speech and behavior are normal.  ____________________________________________     Procedures   ____________________________________________   INITIAL IMPRESSION / ASSESSMENT AND PLAN / ED COURSE  As part of my medical decision making, I reviewed the following data within the electronic MEDICAL RECORD 30 year old male presenting with history and physical exam consistent with allergic reaction most likely secondary to Bactrim.  As such patient is advised to discontinue Bactrim.  Patient given Keflex in the emergency department will be prescribed the same for home. ____________________________________________  FINAL CLINICAL IMPRESSION(S) / ED DIAGNOSES  Final diagnoses:  Allergic reaction, initial encounter     MEDICATIONS GIVEN DURING THIS VISIT:  Medications  cephALEXin (KEFLEX) capsule 500 mg (not administered)     ED Discharge Orders    None       Note:  This document was prepared using Dragon voice recognition software and may include unintentional dictation errors.    Darci Current, MD 07/11/17 [phone]

## 2018-12-30 ENCOUNTER — Other Ambulatory Visit: Payer: Self-pay

## 2018-12-30 DIAGNOSIS — Z20822 Contact with and (suspected) exposure to covid-19: Secondary | ICD-10-CM

## 2018-12-31 LAB — NOVEL CORONAVIRUS, NAA: SARS-CoV-2, NAA: NOT DETECTED

## 2019-01-03 ENCOUNTER — Telehealth: Payer: Self-pay

## 2019-01-03 NOTE — Telephone Encounter (Signed)
Advised patient his COVID 19 test result is negative. °

## 2019-02-21 ENCOUNTER — Other Ambulatory Visit: Payer: Self-pay | Admitting: *Deleted

## 2019-02-21 DIAGNOSIS — Z20822 Contact with and (suspected) exposure to covid-19: Secondary | ICD-10-CM

## 2019-02-22 LAB — NOVEL CORONAVIRUS, NAA: SARS-CoV-2, NAA: NOT DETECTED

## 2019-03-07 ENCOUNTER — Emergency Department
Admission: EM | Admit: 2019-03-07 | Discharge: 2019-03-07 | Disposition: A | Discharge status: Home / Self Care | Payer: Self-pay | Attending: Emergency Medicine | Admitting: Emergency Medicine

## 2019-03-07 ENCOUNTER — Other Ambulatory Visit: Payer: Self-pay

## 2019-03-07 ENCOUNTER — Emergency Department: Payer: Self-pay

## 2019-03-07 ENCOUNTER — Encounter: Payer: Self-pay | Admitting: Emergency Medicine

## 2019-03-07 DIAGNOSIS — F17210 Nicotine dependence, cigarettes, uncomplicated: Secondary | ICD-10-CM | POA: Insufficient documentation

## 2019-03-07 DIAGNOSIS — M25571 Pain in right ankle and joints of right foot: Secondary | ICD-10-CM

## 2019-03-07 DIAGNOSIS — J45909 Unspecified asthma, uncomplicated: Secondary | ICD-10-CM | POA: Insufficient documentation

## 2019-03-07 DIAGNOSIS — M19071 Primary osteoarthritis, right ankle and foot: Secondary | ICD-10-CM | POA: Insufficient documentation

## 2019-03-07 MED ORDER — NAPROXEN 500 MG PO TABS: 500.0000 mg | ORAL_TABLET | Freq: Two times a day (BID) | ORAL | 0 refills | Status: AC

## 2019-03-07 MED ORDER — KETOROLAC TROMETHAMINE 30 MG/ML IJ SOLN
30.0000 mg | Freq: Once | INTRAMUSCULAR | Status: AC
  Administered 2019-03-07: 30 mg via INTRAMUSCULAR
  Filled 2019-03-07: qty 1

## 2019-03-07 NOTE — Discharge Instructions (Signed)
Your x-ray shows some swelling and some arthritis in your ankle.  Please use Ace wrap and crutches.  Take naproxen for pain and inflammation.  Please make an appointment with primary care or orthopedics for reevaluation this week.

## 2019-03-07 NOTE — ED Provider Notes (Signed)
[facility] Emergency Department Provider Note  ____________________________________________  Time seen: Approximately 3:48 PM  I have reviewed the triage vital signs and the nursing notes.   HISTORY  Chief Complaint Foot Pain    HPI Justin [name] is a 23 y.o. male that presents to the emergency department for evaluation of right foot pain, worse today. He was unable to bear weight this morning.  Patient states that he has had intermittent ankle pain and swelling for a while.  No trauma. No wounds. Patient is on his feet all day for work.  He wears [employer] shoes to work. No history of gout.  No IV drug use.  No fevers.   Past Medical History:  Diagnosis Date  . Asthma   . Priapism     There are no active problems to display for this patient.   History reviewed. No pertinent surgical history.  Prior to Admission medications   Medication Sig Start Date End Date Taking? Authorizing Provider  albuterol (PROVENTIL HFA;VENTOLIN HFA) 108 (90 Base) MCG/ACT inhaler Inhale 2 puffs into the lungs every 6 (six) hours as needed for wheezing or shortness of breath. 03/20/16   Daymon Larsen, MD  albuterol (PROVENTIL HFA;VENTOLIN HFA) 108 (90 Base) MCG/ACT inhaler Inhale 2 puffs into the lungs every 6 (six) hours as needed for wheezing or shortness of breath. 03/29/16   Lisa Roca, MD  naproxen (NAPROSYN) 500 MG tablet Take 1 tablet (500 mg total) by mouth 2 (two) times daily with a meal. 03/07/19 03/06/20  Laban Emperor, PA-C    Allergies Patient has no known allergies.  No family history on file.  Social History Social History   Tobacco Use  . Smoking status: Current Every Day Smoker    Packs/day: 0.50    Types: Cigarettes  . Smokeless tobacco: Never Used  Substance Use Topics  . Alcohol use: Yes  . Drug use: No     Review of Systems  Constitutional: No fever/chills Cardiovascular: No chest pain. Respiratory: No SOB. Gastrointestinal: No  vomiting.  Musculoskeletal: Positive for foot pain. Skin: Negative for rash, abrasions, lacerations, ecchymosis. Neurological: Negative for numbness or tingling   ____________________________________________   PHYSICAL EXAM:  VITAL SIGNS: ED Triage Vitals  Enc Vitals Group     BP 03/07/19 1517 (!) 164/97     Pulse Rate 03/07/19 1517 69     Resp 03/07/19 1517 16     Temp 03/07/19 1517 98.4 F (36.9 C)     Temp Source 03/07/19 1517 Oral     SpO2 03/07/19 1517 96 %     Weight 03/07/19 1517 250 lb (113.4 kg)     Height 03/07/19 1517 6\' 1"  (1.854 m)     Head Circumference --      Peak Flow --      Pain Score 03/07/19 1531 7     Pain Loc --      Pain Edu? --      Excl. in [place]? --      Constitutional: Alert and oriented. Well appearing and in no acute distress. Eyes: Conjunctivae are normal. PERRL. EOMI. Head: Atraumatic. ENT:      Ears:      Nose: No congestion/rhinnorhea.      Mouth/Throat: Mucous membranes are moist.  Neck: No stridor.  Cardiovascular: Normal rate, regular rhythm.  Good peripheral circulation. Respiratory: Normal respiratory effort without tachypnea or retractions. Lungs CTAB. Good air entry to the bases with no decreased or absent breath sounds. Musculoskeletal:  Full range of motion to all extremities. No gross deformities appreciated.  Mild swelling to right lateral malleolus.  No erythema or warmth.  Limited range of motion of right ankle due to pain.  No wounds. Neurologic:  Normal speech and language. No gross focal neurologic deficits are appreciated.  Skin:  Skin is warm, dry and intact. No rash noted. Psychiatric: Mood and affect are normal. Speech and behavior are normal. Patient exhibits appropriate insight and judgement.   ____________________________________________   LABS (all labs ordered are listed, but only abnormal results are displayed)  Labs Reviewed - No data to  display ____________________________________________  EKG   ____________________________________________  RADIOLOGY Lexine Baton, personally viewed and evaluated these images (plain radiographs) as part of my medical decision making, as well as reviewing the written report by the radiologist.  Dg Ankle Complete Right  Result Date: 03/07/2019 CLINICAL DATA:  Pain, lateral malleolus, no known injury EXAM: RIGHT FOOT - 2 VIEW; RIGHT ANKLE - COMPLETE 3+ VIEW COMPARISON:  None. FINDINGS: No fracture or dislocation of the right foot or ankle. There is mild ankle mortise, midfoot, and first metatarsophalangeal arthrosis. There is soft tissue edema about the ankle. IMPRESSION: 1. No fracture or dislocation of the right foot or ankle. There is mild ankle mortise, midfoot, and first metatarsophalangeal arthrosis. 2.  Soft tissue edema about the ankle. Electronically Signed   By: Lauralyn Primes M.D.   On: 03/07/2019 16:20   Dg Foot 2 Views Right  Result Date: 03/07/2019 CLINICAL DATA:  Pain, lateral malleolus, no known injury EXAM: RIGHT FOOT - 2 VIEW; RIGHT ANKLE - COMPLETE 3+ VIEW COMPARISON:  None. FINDINGS: No fracture or dislocation of the right foot or ankle. There is mild ankle mortise, midfoot, and first metatarsophalangeal arthrosis. There is soft tissue edema about the ankle. IMPRESSION: 1. No fracture or dislocation of the right foot or ankle. There is mild ankle mortise, midfoot, and first metatarsophalangeal arthrosis. 2.  Soft tissue edema about the ankle. Electronically Signed   By: Lauralyn Primes M.D.   On: 03/07/2019 16:20    ____________________________________________    PROCEDURES  Procedure(s) performed:    Procedures    Medications  ketorolac (TORADOL) 30 MG/ML injection 30 mg (30 mg Intramuscular Given 03/07/19 1750)     ____________________________________________   INITIAL IMPRESSION / ASSESSMENT AND PLAN / ED COURSE  Pertinent labs & imaging results that were  available during my care of the patient were reviewed by me and considered in my medical decision making (see chart for details).  Review of the [place] CSRS was performed in accordance of the [facility] prior to dispensing any controlled drugs.   Patient presented to emergency department for evaluation of ankle pain and swelling.  Vital signs and exam are reassuring.  X-ray negative for acute bony abnormalities.  Symptoms are likely inflammatory, most likely from arthritis.  Lower suspicion for gout.  Patient will be discharged home with prescriptions for naproxyn. Patient is to follow up with PCP or ortho as directed. Patient is given ED precautions to return to the ED for any worsening or new symptoms.   Justin [name] was evaluated in Emergency Department on 03/07/2019 for the symptoms described in the history of present illness. He was evaluated in the context of the global COVID-19 pandemic, which necessitated consideration that the patient might be at risk for infection with the SARS-CoV-2 virus that causes COVID-19. Institutional protocols and algorithms that pertain to the evaluation of patients at risk for COVID-19  are in a state of rapid change based on information released by regulatory bodies including the CDC and federal and state organizations. These policies and algorithms were followed during the patient's care in the ED.  ____________________________________________  FINAL CLINICAL IMPRESSION(S) / ED DIAGNOSES  Final diagnoses:  Acute right ankle pain  Osteoarthritis of right ankle, unspecified osteoarthritis type      NEW MEDICATIONS STARTED DURING THIS VISIT:  ED Discharge Orders         Ordered    naproxen (NAPROSYN) 500 MG tablet  2 times daily with meals     03/07/19 1719              This chart was dictated using voice recognition software/Dragon. Despite best efforts to proofread, errors can occur which can change the meaning. Any change was purely unintentional.     Enid DerryWagner, Alyssah Algeo, PA-C 03/07/19 2210    Minna AntisPaduchowski, Kevin, MD 03/07/19 2258

## 2019-03-07 NOTE — ED Triage Notes (Signed)
Presents with pain to posterior right heel/foot  Unsure of injury  Unable to bear wt

## 2020-04-21 ENCOUNTER — Other Ambulatory Visit: Payer: Self-pay

## 2020-04-21 ENCOUNTER — Emergency Department: Payer: Medicaid Other

## 2020-04-21 ENCOUNTER — Encounter: Payer: Self-pay | Admitting: Emergency Medicine

## 2020-04-21 ENCOUNTER — Emergency Department
Admission: EM | Admit: 2020-04-21 | Discharge: 2020-04-21 | Disposition: A | Discharge status: Home / Self Care | Payer: Medicaid Other | Attending: Emergency Medicine | Admitting: Emergency Medicine

## 2020-04-21 DIAGNOSIS — I10 Essential (primary) hypertension: Secondary | ICD-10-CM | POA: Insufficient documentation

## 2020-04-21 DIAGNOSIS — F17210 Nicotine dependence, cigarettes, uncomplicated: Secondary | ICD-10-CM | POA: Insufficient documentation

## 2020-04-21 DIAGNOSIS — Z79899 Other long term (current) drug therapy: Secondary | ICD-10-CM | POA: Insufficient documentation

## 2020-04-21 DIAGNOSIS — J45909 Unspecified asthma, uncomplicated: Secondary | ICD-10-CM | POA: Insufficient documentation

## 2020-04-21 DIAGNOSIS — J0110 Acute frontal sinusitis, unspecified: Secondary | ICD-10-CM

## 2020-04-21 LAB — COMPREHENSIVE METABOLIC PANEL
ALT: 40 U/L (ref 0–44)
AST: 22 U/L (ref 15–41)
Albumin: 4.4 g/dL (ref 3.5–5.0)
Alkaline Phosphatase: 73 U/L (ref 38–126)
Anion gap: 11 (ref 5–15)
BUN: 15 mg/dL (ref 6–20)
CO2: 29 mmol/L (ref 22–32)
Calcium: 9.1 mg/dL (ref 8.9–10.3)
Chloride: 99 mmol/L (ref 98–111)
Creatinine, Ser: 0.94 mg/dL (ref 0.61–1.24)
GFR, Estimated: >60 mL/min (ref >60)
Glucose, Bld: 87 mg/dL (ref 70–99)
Potassium: 4.5 mmol/L (ref 3.5–5.1)
Sodium: 139 mmol/L (ref 135–145)
Total Bilirubin: 1.2 mg/dL (ref 0.3–1.2)
Total Protein: 8 g/dL (ref 6.5–8.1)

## 2020-04-21 LAB — CBC WITH DIFFERENTIAL/PLATELET
Abs Immature Granulocytes: 0.04 10*3/uL (ref 0.00–0.07)
Basophils Absolute: 0.1 10*3/uL (ref 0.0–0.1)
Basophils Relative: 1 %
Eosinophils Absolute: 0.4 10*3/uL (ref 0.0–0.5)
Eosinophils Relative: 3 %
HCT: 51 % (ref 39.0–52.0)
Hemoglobin: 17.2 g/dL — ABNORMAL HIGH (ref 13.0–17.0)
Immature Granulocytes: 0 %
Lymphocytes Relative: 33 %
Lymphs Abs: 4 10*3/uL (ref 0.7–4.0)
MCH: 29.5 pg (ref 26.0–34.0)
MCHC: 33.7 g/dL (ref 30.0–36.0)
MCV: 87.5 fL (ref 80.0–100.0)
Monocytes Absolute: 1 10*3/uL (ref 0.1–1.0)
Monocytes Relative: 8 %
Neutro Abs: 6.8 10*3/uL (ref 1.7–7.7)
Neutrophils Relative %: 55 %
Platelets: 290 10*3/uL (ref 150–400)
RBC: 5.83 MIL/uL — ABNORMAL HIGH (ref 4.22–5.81)
RDW: 12.4 % (ref 11.5–15.5)
WBC: 12.3 10*3/uL — ABNORMAL HIGH (ref 4.0–10.5)
nRBC: 0 % (ref 0.0–0.2)

## 2020-04-21 MED ORDER — AMLODIPINE BESYLATE 5 MG PO TABS: 5.0000 mg | ORAL_TABLET | Freq: Every day | ORAL | 1 refills | Status: DC

## 2020-04-21 MED ORDER — AMLODIPINE BESYLATE 5 MG PO TABS
5.0000 mg | ORAL_TABLET | Freq: Once | ORAL | Status: AC
  Administered 2020-04-21: 5 mg via ORAL
  Filled 2020-04-21: qty 1

## 2020-04-21 MED ORDER — KETOROLAC TROMETHAMINE 30 MG/ML IJ SOLN
15.0000 mg | Freq: Once | INTRAMUSCULAR | Status: AC
  Administered 2020-04-21: 15 mg via INTRAVENOUS
  Filled 2020-04-21: qty 1

## 2020-04-21 MED ORDER — OXYMETAZOLINE HCL 0.05 % NA SOLN: 2.0000 | Freq: Two times a day (BID) | NASAL | 0 refills | Status: AC

## 2020-04-21 MED ORDER — FLUTICASONE PROPIONATE 50 MCG/ACT NA SUSP: 1.0000 | Freq: Every day | NASAL | 0 refills | Status: AC

## 2020-04-21 NOTE — ED Notes (Signed)
Patient transported to CT 

## 2020-04-21 NOTE — ED Notes (Signed)
RN spoke with Dr. Larinda Buttery about pts presentation. Verbal order given for EKG and CT head without contrast.

## 2020-04-21 NOTE — ED Notes (Signed)
Pt to ED for c/o HA for one week. States that he got COVID tested last week for free to see if he was, states he was not having symptoms. Pt states that it was negative. Pt states HA for one week not relieved by Tylenol or Motrin. States that he was dx with HTN " a while ago" and prescribed medication, but "I never picked it up".

## 2020-04-21 NOTE — ED Triage Notes (Signed)
Pt to ED via POV c/o nasal congestion and headache x 1 week. Pt states that he has taken OTC medication without relief. Pt is in NAD.

## 2020-04-21 NOTE — Discharge Instructions (Signed)
You can use the oxymetazoline and Flonase nasal sprays to help with your sinus congestion. Do not use the oxymetazoline for more than 3 days as it will start to cause rebound symptoms if used for an extended period of time. Please schedule follow-up with your PCP for recheck of your blood pressure and return to the ED for new or worsening symptoms.

## 2020-04-21 NOTE — ED Provider Notes (Signed)
[facility] Emergency Department Provider Note   ____________________________________________   First MD Initiated Contact with Patient 04/21/20 1131     (approximate)  I have reviewed the triage vital signs and the nursing notes.   HISTORY  Chief Complaint Nasal Congestion and Headache    HPI Justin [name] is a 24 y.o. male with past medical history of asthma who presents to the ED complaining of headache. Patient reports he has had 1 week of constant pain over the left side of his forehead, just above his eyebrow. Area is tender to touch and has been dealing with significant congestion, but denies drainage from his nose. He has not had any fevers, cough, chest pain, shortness of breath, vomiting, or diarrhea. He has taken Tylenol and ibuprofen for the pain, but without significant relief. Patient noted to be hypertensive in triage, reports being told that his blood pressure is high in the past and was prescribed medication for it, but has never taken the medication.        Past Medical History:  Diagnosis Date  . Asthma   . Priapism     There are no problems to display for this patient.   History reviewed. No pertinent surgical history.  Prior to Admission medications   Medication Sig Start Date End Date Taking? Authorizing Provider  albuterol (VENTOLIN HFA) 108 (90 Base) MCG/ACT inhaler Inhale 2 puffs into the lungs every 4 (four) hours as needed for wheezing. 07/15/19  Yes [provider]  amLODipine (NORVASC) 5 MG tablet Take 1 tablet (5 mg total) by mouth daily. 04/21/20 06/20/20  Chesley Noon, MD  fluticasone (FLONASE) 50 MCG/ACT nasal spray Place 1 spray into both nostrils daily. 04/21/20 04/21/21  Chesley Noon, MD  oxymetazoline (AFRIN) 0.05 % nasal spray Place 2 sprays into both nostrils 2 (two) times daily for 3 days. 04/21/20 04/24/20  Chesley Noon, MD    Allergies Other  No family history on file.  Social  History Social History   Tobacco Use  . Smoking status: Current Every Day Smoker    Packs/day: 0.50    Types: Cigarettes  . Smokeless tobacco: Never Used  Substance Use Topics  . Alcohol use: Yes  . Drug use: No    Review of Systems  Constitutional: No fever/chills Eyes: No visual changes. ENT: No sore throat. Positive for congestion. Cardiovascular: Denies chest pain. Respiratory: Denies shortness of breath. Gastrointestinal: No abdominal pain.  No nausea, no vomiting.  No diarrhea.  No constipation. Genitourinary: Negative for dysuria. Musculoskeletal: Negative for back pain. Skin: Negative for rash. Neurological: Positive for headaches, negative for focal weakness or numbness.  ____________________________________________   PHYSICAL EXAM:  VITAL SIGNS: ED Triage Vitals  Enc Vitals Group     BP 04/21/20 1052 (S) (!) 216/138     Pulse Rate 04/21/20 1052 96     Resp 04/21/20 1052 16     Temp 04/21/20 1054 98.6 F (37 C)     Temp Source 04/21/20 1052 Oral     SpO2 04/21/20 1052 95 %     Weight 04/21/20 1053 206 lb (93.4 kg)     Height 04/21/20 1053 6\' 1"  (1.854 m)     Head Circumference --      Peak Flow --      Pain Score 04/21/20 1052 9     Pain Loc --      Pain Edu? --      Excl. in GC? --  Constitutional: Alert and oriented. Eyes: Conjunctivae are normal. Head: Atraumatic. Tenderness to palpation over left frontal sinus, otherwise no facial tenderness. Nose: Congestion/rhinnorhea noted. Mouth/Throat: Mucous membranes are moist. Neck: Normal ROM Cardiovascular: Normal rate, regular rhythm. Grossly normal heart sounds. Respiratory: Normal respiratory effort.  No retractions. Lungs CTAB. Gastrointestinal: Soft and nontender. No distention. Genitourinary: deferred Musculoskeletal: No lower extremity tenderness nor edema. Neurologic:  Normal speech and language. No gross focal neurologic deficits are appreciated. Skin:  Skin is warm, dry and intact. No  rash noted. Psychiatric: Mood and affect are normal. Speech and behavior are normal.  ____________________________________________   LABS (all labs ordered are listed, but only abnormal results are displayed)  Labs Reviewed  CBC WITH DIFFERENTIAL/PLATELET - Abnormal; Notable for the following components:      Result Value   WBC 12.3 (*)    RBC 5.83 (*)    Hemoglobin 17.2 (*)    All other components within normal limits  COMPREHENSIVE METABOLIC PANEL   ____________________________________________  EKG  ED ECG REPORT I, Chesley Noon, the attending physician, personally viewed and interpreted this ECG.   Date: 04/21/2020  EKG Time: 10:59  Rate: 93  Rhythm: normal sinus rhythm  Axis: Normal  Intervals:none  ST&T Change: None   PROCEDURES  Procedure(s) performed (including Critical Care):  Procedures   ____________________________________________   INITIAL IMPRESSION / ASSESSMENT AND PLAN / ED COURSE       24 year old male presents to the ED with 1 week of constant headache in the area of his left frontal sinus. Area is tender to palpation and I suspect his pain is due to a left frontal sinusitis. Antibiotics are not indicated given symptoms less than 1 week with no fever or purulent nasal drainage. Given his markedly elevated blood pressure, we will further assess with CT head to ensure no other acute process. We will screen labs, treat blood pressure with amlodipine and treat his headache with Toradol.  Patient reports partial improvement in his headache following IV Toradol. CT head reviewed by me and no obvious hemorrhage, negative for acute process per radiology but does show some signs of sinusitis consistent with his symptoms. We will treat with Afrin and Flonase, also start patient on amlodipine for likely hypertension. He was counseled to follow-up with PCP and return to the ED for new worsening symptoms. Patient agrees with plan.       ____________________________________________   FINAL CLINICAL IMPRESSION(S) / ED DIAGNOSES  Final diagnoses:  Acute frontal sinusitis, recurrence not specified  Hypertension, unspecified type     ED Discharge Orders         Ordered    oxymetazoline (AFRIN) 0.05 % nasal spray  2 times daily        04/21/20 1307    amLODipine (NORVASC) 5 MG tablet  Daily        04/21/20 1307    fluticasone (FLONASE) 50 MCG/ACT nasal spray  Daily        04/21/20 1307           Note:  This document was prepared using Dragon voice recognition software and may include unintentional dictation errors.   Chesley Noon, MD 04/21/20 1309

## 2020-07-24 ENCOUNTER — Encounter: Payer: Self-pay | Admitting: Emergency Medicine

## 2020-07-24 ENCOUNTER — Emergency Department
Admission: EM | Admit: 2020-07-24 | Discharge: 2020-07-24 | Disposition: A | Discharge status: Home / Self Care | Payer: Medicaid Other | Attending: Emergency Medicine | Admitting: Emergency Medicine

## 2020-07-24 ENCOUNTER — Other Ambulatory Visit: Payer: Self-pay

## 2020-07-24 DIAGNOSIS — Z79899 Other long term (current) drug therapy: Secondary | ICD-10-CM | POA: Insufficient documentation

## 2020-07-24 DIAGNOSIS — J45909 Unspecified asthma, uncomplicated: Secondary | ICD-10-CM | POA: Insufficient documentation

## 2020-07-24 DIAGNOSIS — F17210 Nicotine dependence, cigarettes, uncomplicated: Secondary | ICD-10-CM | POA: Insufficient documentation

## 2020-07-24 DIAGNOSIS — N4830 Priapism, unspecified: Secondary | ICD-10-CM | POA: Insufficient documentation

## 2020-07-24 LAB — CBC
HCT: 48.2 % (ref 39.0–52.0)
Hemoglobin: 16 g/dL (ref 13.0–17.0)
MCH: 29.4 pg (ref 26.0–34.0)
MCHC: 33.2 g/dL (ref 30.0–36.0)
MCV: 88.4 fL (ref 80.0–100.0)
Platelets: 284 10*3/uL (ref 150–400)
RBC: 5.45 MIL/uL (ref 4.22–5.81)
RDW: 12.4 % (ref 11.5–15.5)
WBC: 9.2 10*3/uL (ref 4.0–10.5)
nRBC: 0 % (ref 0.0–0.2)

## 2020-07-24 LAB — COMPREHENSIVE METABOLIC PANEL
ALT: 21 U/L (ref 0–44)
AST: 18 U/L (ref 15–41)
Albumin: 4.4 g/dL (ref 3.5–5.0)
Alkaline Phosphatase: 61 U/L (ref 38–126)
Anion gap: 8 (ref 5–15)
BUN: 12 mg/dL (ref 6–20)
CO2: 29 mmol/L (ref 22–32)
Calcium: 8.8 mg/dL — ABNORMAL LOW (ref 8.9–10.3)
Chloride: 100 mmol/L (ref 98–111)
Creatinine, Ser: 0.87 mg/dL (ref 0.61–1.24)
GFR, Estimated: >60 mL/min (ref >60)
Glucose, Bld: 106 mg/dL — ABNORMAL HIGH (ref 70–99)
Potassium: 4.7 mmol/L (ref 3.5–5.1)
Sodium: 137 mmol/L (ref 135–145)
Total Bilirubin: 0.9 mg/dL (ref 0.3–1.2)
Total Protein: 7.8 g/dL (ref 6.5–8.1)

## 2020-07-24 LAB — PROTIME-INR
INR: 0.9 (ref 0.8–1.2)
Prothrombin Time: 11.6 seconds (ref 11.4–15.2)

## 2020-07-24 LAB — APTT: aPTT: 29 seconds (ref 24–36)

## 2020-07-24 MED ORDER — ONDANSETRON HCL 4 MG/2ML IJ SOLN
INTRAMUSCULAR | Status: AC
  Filled 2020-07-24: qty 2

## 2020-07-24 MED ORDER — FENTANYL CITRATE (PF) 100 MCG/2ML IJ SOLN
INTRAMUSCULAR | Status: AC
  Filled 2020-07-24: qty 2

## 2020-07-24 MED ORDER — FENTANYL CITRATE (PF) 100 MCG/2ML IJ SOLN
100.0000 ug | Freq: Once | INTRAMUSCULAR | Status: AC
  Administered 2020-07-24: 100 ug via INTRAVENOUS

## 2020-07-24 MED ORDER — PHENYLEPHRINE 200 MCG/ML FOR PRIAPISM / HYPOTENSION
200.0000 ug | INTRAMUSCULAR | Status: DC | PRN
  Administered 2020-07-24: 200 ug via INTRACAVERNOUS
  Filled 2020-07-24 (×2): qty 50

## 2020-07-24 MED ORDER — HYDRALAZINE HCL 20 MG/ML IJ SOLN
10.0000 mg | Freq: Once | INTRAMUSCULAR | Status: AC
  Administered 2020-07-24: 10 mg via INTRAVENOUS
  Filled 2020-07-24: qty 1

## 2020-07-24 MED ORDER — ONDANSETRON HCL 4 MG/2ML IJ SOLN
4.0000 mg | Freq: Once | INTRAMUSCULAR | Status: AC
  Administered 2020-07-24: 4 mg via INTRAVENOUS

## 2020-07-24 MED ORDER — SODIUM CHLORIDE 0.9 % IV BOLUS
1000.0000 mL | Freq: Once | INTRAVENOUS | Status: AC
  Administered 2020-07-24: 1000 mL via INTRAVENOUS

## 2020-07-24 NOTE — Consult Note (Signed)
     Urology Consult   I have been asked to see the patient by Dr. Scotty Court, for evaluation and management of priapism.  Chief Complaint: Priapism  HPI:  Justin [name] is a 25 y.o. year old with history of recurrent priapism who presents to ED with a painful erection for 6 hours.  He reportedly has had multiple ED visits previously for priapism, and required cavernosal irrigation once previously over 5 years ago.  He reportedly had negative sickle cell testing, but those notes and labs are unavailable to me.  He denies any fevers, chills, or difficulty urinating.  Urine is yellow.  He endorses marijuana use last night, but no other drugs.  Denies any penile injections or PDE 5 inhibitors.  No aggravating or alleviating factors.  Severity is severe.  PMH: Past Medical History:  Diagnosis Date  . Asthma   . Priapism       Allergies:  Allergies  Allergen Reactions  . Other Hives    Sulfate    Family History: History reviewed. No pertinent family history.  Social History:  reports that he has been smoking cigarettes. He has been smoking about 0.50 packs per day. He has never used smokeless tobacco. He reports current alcohol use. He reports that he does not use drugs.  ROS: Negative aside from those stated in the HPI.  Physical Exam: BP (!) 182/111   Pulse 94   Temp 98.2 F (36.8 C) (Oral)   Resp (!) 22   Ht 6\' 1"  (1.854 m)   Wt 117.9 kg   SpO2 95%   BMI 34.30 kg/m    Constitutional:  Alert and oriented, No acute distress. Cardiovascular: No clubbing, cyanosis, or edema. Respiratory: Normal respiratory effort, no increased work of breathing. GI: Abdomen is soft, nontender, nondistended, no abdominal masses GU: Circumcised phallus with firm and painful erection  Laboratory Data: Reviewed  Pertinent Imaging: None to review  Assessment & Plan:   25 year old African-American male with recurrent priapism of unclear etiology.  He reportedly has had a negative  sickle cell work-up, but those records are not available to me.  With his ongoing recurrence is probably worth a referral to hematology for consideration of repeat work-up.   See procedure note for full details of priapism treatment including phenylephrine injection.  30 minutes later, the penis remained flaccid and non-painful, and he was discharged with clinic follow-up in 3 to 4 weeks.  30, MD  Total time spent on the floor was 90 minutes, with greater than 50% spent in counseling and coordination of care with the patient regarding priapism.  [facility] [address], [address] [place], [place] [phone]

## 2020-07-24 NOTE — ED Triage Notes (Signed)
Pt to ED via POV with c/o priapism. Pt states has had erection since 0400 this morning when he was awakened with pain. Pt states takes Amlodipine for his BP.

## 2020-07-24 NOTE — ED Notes (Signed)
Pt placed on cardiac monitor and 2L via [place] due to O2 sats immediately to 92 after administration of pain medication as ordered by EDP stafford.

## 2020-07-24 NOTE — ED Notes (Signed)
MD placed bandage around pt penis using gauze and coband to prevent further bleeding and reduce risk of hematoma.

## 2020-07-24 NOTE — Procedures (Signed)
   07/24/20 11:00 AM   Justin [name] 08-24-1995 [id number]  Reason for procedure: Priapism  Procedure: Cavernosal aspiration, injection of phenylephrine  Anesthesia: Local  Brief history: 25 year old male with 6 hours of a painful erection  Informed consent was obtained and we specifically discussed the risks of bleeding, infection, recurrence, and erectile dysfunction.  He was connected to telemetry for close monitoring of vitals.  He was prepped and draped in standard sterile fashion with Betadine.  A total of 10 mL of 1% lidocaine was injected for a ring block around the base of the penis.  An 18-gauge needle was inserted into the right corpora with drainage of dark motor oil appearing blood consistent with ischemic priapism.  He continued to have recurrence of his erection despite drainage of dark blood through the needle.  At this point I injected 1 mL increments at 5-minute intervals of 200 mcg phenylephrine through an additional 21-gauge needle placed into the corpora until detumescence.  This required a total of 3 mL of phenylephrine.  Under direct observation for the next 15 minutes the penis remained flaccid.  Needles were removed and a snug dressing with gauze and Coban was applied.  30 minutes later the penis still remained flaccid.  Return precautions discussed extensively.  We will arrange follow-up in clinic in 3 to 4 weeks, as well as referral to hematology for evaluation for sickle cell.   Sondra Come, MD  [facility] [address] [place], [place] [address] [phone]

## 2020-07-24 NOTE — Discharge Instructions (Addendum)
It is important to follow-up with urology in clinic to help prevent further episodes of priapism which can cause permanent sexual dysfunction

## 2020-07-24 NOTE — ED Provider Notes (Signed)
[facility] Emergency Department Provider Note  ____________________________________________  Time seen: Approximately 12:00 PM  I have reviewed the triage vital signs and the nursing notes.   HISTORY  Chief Complaint Priapism    HPI Justin [name] is a 25 y.o. male with a history of asthma and priapism and uncontrolled high blood pressure who comes the ED today due to priapism, started at 4:00 AM, associated with severe penile pain which is nonradiating, no aggravating or alleviating factors.   Denies any recent trauma.  No recent medications   Past Medical History:  Diagnosis Date  . Asthma   . Priapism      There are no problems to display for this patient.    History reviewed. No pertinent surgical history.   Prior to Admission medications   Medication Sig Start Date End Date Taking? Authorizing Provider  albuterol (VENTOLIN HFA) 108 (90 Base) MCG/ACT inhaler Inhale 2 puffs into the lungs every 4 (four) hours as needed for wheezing. 07/15/19   [provider]  amLODipine (NORVASC) 5 MG tablet Take 1 tablet (5 mg total) by mouth daily. 04/21/20 06/20/20  Chesley Noon, MD  fluticasone (FLONASE) 50 MCG/ACT nasal spray Place 1 spray into both nostrils daily. 04/21/20 04/21/21  Chesley Noon, MD     Allergies Other   History reviewed. No pertinent family history.  Social History Social History   Tobacco Use  . Smoking status: Current Every Day Smoker    Packs/day: 0.50    Types: Cigarettes  . Smokeless tobacco: Never Used  Substance Use Topics  . Alcohol use: Yes  . Drug use: No    Review of Systems  Constitutional:   No fever or chills.  ENT:   No sore throat. No rhinorrhea. Cardiovascular:   No chest pain or syncope. Respiratory:   No dyspnea or cough. Gastrointestinal:   Negative for abdominal pain, vomiting and diarrhea.  Musculoskeletal:   Negative for focal pain or swelling All other systems reviewed and  are negative except as documented above in ROS and HPI.  ____________________________________________   PHYSICAL EXAM:  VITAL SIGNS: ED Triage Vitals  Enc Vitals Group     BP 07/24/20 0817 (!) 184/141     Pulse Rate 07/24/20 0817 95     Resp 07/24/20 0817 18     Temp 07/24/20 0817 98.2 F (36.8 C)     Temp Source 07/24/20 0817 Oral     SpO2 07/24/20 0817 99 %     Weight 07/24/20 0817 260 lb (117.9 kg)     Height 07/24/20 0817 6\' 1"  (1.854 m)     Head Circumference --      Peak Flow --      Pain Score 07/24/20 0828 10     Pain Loc --      Pain Edu? --      Excl. in GC? --     Vital signs reviewed, nursing assessments reviewed.   Constitutional:   Alert and oriented. Non-toxic appearance. Eyes:   Conjunctivae are normal. EOMI. PERRL. ENT      Head:   Normocephalic and atraumatic.      Nose:   Wearing a mask.      Mouth/Throat:   Wearing a mask.      Neck:   No meningismus. Full ROM. Hematological/Lymphatic/Immunilogical:   No cervical lymphadenopathy. Cardiovascular:   RRR. Symmetric bilateral radial and DP pulses.  No murmurs. Cap refill less than 2 seconds. Respiratory:   Normal respiratory  effort without tachypnea/retractions. Breath sounds are clear and equal bilaterally. No wheezes/rales/rhonchi. Gastrointestinal:   Soft and nontender. Non distended. There is no CVA tenderness.  No rebound, rigidity, or guarding. Genitourinary:   Penis erect, no deformity or wounds.  No rash, normal color Musculoskeletal:   Normal range of motion in all extremities. No joint effusions.  No lower extremity tenderness.  No edema. Neurologic:   Normal speech and language.  Motor grossly intact. No acute focal neurologic deficits are appreciated.  Skin:    Skin is warm, dry and intact. No rash noted.  No petechiae, purpura, or bullae.  ____________________________________________    LABS (pertinent positives/negatives) (all labs ordered are listed, but only abnormal results are  displayed) Labs Reviewed  COMPREHENSIVE METABOLIC PANEL - Abnormal; Notable for the following components:      Result Value   Glucose, Bld 106 (*)    Calcium 8.8 (*)    All other components within normal limits  CBC  PROTIME-INR  APTT   ____________________________________________   EKG    ____________________________________________    RADIOLOGY  No results found.  ____________________________________________   PROCEDURES Procedures  ____________________________________________     CLINICAL IMPRESSION / ASSESSMENT AND PLAN / ED COURSE  Medications ordered in the ED: Medications  phenylephrine 200 mcg / ml CONC. DILUTION INJ (ED / Urology USE ONLY) (has no administration in time range)  fentaNYL (SUBLIMAZE) injection 100 mcg (100 mcg Intravenous Given 07/24/20 0841)  ondansetron (ZOFRAN) injection 4 mg (4 mg Intravenous Given 07/24/20 0840)  sodium chloride 0.9 % bolus 1,000 mL (1,000 mLs Intravenous New Bag/Given 07/24/20 0931)  hydrALAZINE (APRESOLINE) injection 10 mg (10 mg Intravenous Given 07/24/20 0932)    Pertinent labs & imaging results that were available during my care of the patient were reviewed by me and considered in my medical decision making (see chart for details).  Justin [name] was evaluated in Emergency Department on 07/24/2020 for the symptoms described in the history of present illness. He was evaluated in the context of the global COVID-19 pandemic, which necessitated consideration that the patient might be at risk for infection with the SARS-CoV-2 virus that causes COVID-19. Institutional protocols and algorithms that pertain to the evaluation of patients at risk for COVID-19 are in a state of rapid change based on information released by regulatory bodies including the CDC and federal and state organizations. These policies and algorithms were followed during the patient's care in the ED.     Clinical Course as of 07/24/20 1200  Wed Jul 24, 2020  1001 Patient presents with priapism which is a recurrent issue.  Severely elevated blood pressure, get will give IV hydralazine, supplemental oxygen, IV fluids, urology consult. [PS]  1003 D/w Dr.Sninsky [PS]  1053 Priapism resolved after bedside treatment by Dr. Richardo Hanks [PS]    Clinical Course User Index [PS] Sharman Cheek, MD     ----------------------------------------- 12:02 PM on 07/24/2020 -----------------------------------------  Continues to feel fine, given return precautions and encouraged to follow-up in urology clinic.  ____________________________________________   FINAL CLINICAL IMPRESSION(S) / ED DIAGNOSES    Final diagnoses:  Priapism     ED Discharge Orders    None      Portions of this note were generated with dragon dictation software. Dictation errors may occur despite best attempts at proofreading.   Sharman Cheek, MD 07/24/20 1202

## 2020-07-25 ENCOUNTER — Telehealth: Payer: Self-pay | Admitting: Urology

## 2020-07-25 NOTE — Telephone Encounter (Signed)
App made patient is aware  Justin [name] 

## 2020-07-25 NOTE — Telephone Encounter (Signed)
-----   Message from Sondra Come, MD sent at 07/24/2020 12:21 PM EST ----- Regarding: follow up Please schedule follow-up in 3 to 4 weeks for priapism follow-up, thanks  Legrand Rams, MD 07/24/2020

## 2020-08-10 ENCOUNTER — Emergency Department
Admission: EM | Admit: 2020-08-10 | Discharge: 2020-08-10 | Disposition: A | Discharge status: Home / Self Care | Payer: Medicaid Other | Attending: Emergency Medicine | Admitting: Emergency Medicine

## 2020-08-10 ENCOUNTER — Encounter: Payer: Self-pay | Admitting: Emergency Medicine

## 2020-08-10 ENCOUNTER — Other Ambulatory Visit: Payer: Self-pay

## 2020-08-10 DIAGNOSIS — F17210 Nicotine dependence, cigarettes, uncomplicated: Secondary | ICD-10-CM | POA: Insufficient documentation

## 2020-08-10 DIAGNOSIS — N4830 Priapism, unspecified: Secondary | ICD-10-CM | POA: Insufficient documentation

## 2020-08-10 DIAGNOSIS — I10 Essential (primary) hypertension: Secondary | ICD-10-CM

## 2020-08-10 DIAGNOSIS — Z79899 Other long term (current) drug therapy: Secondary | ICD-10-CM | POA: Insufficient documentation

## 2020-08-10 DIAGNOSIS — J45909 Unspecified asthma, uncomplicated: Secondary | ICD-10-CM | POA: Insufficient documentation

## 2020-08-10 MED ORDER — PSEUDOEPHEDRINE HCL 30 MG PO TABS
30.0000 mg | ORAL_TABLET | Freq: Once | ORAL | Status: AC
  Administered 2020-08-10: 30 mg via ORAL
  Filled 2020-08-10: qty 1

## 2020-08-10 MED ORDER — PHENYLEPHRINE 200 MCG/ML FOR PRIAPISM / HYPOTENSION
500.0000 ug | Freq: Once | INTRAMUSCULAR | Status: AC
  Administered 2020-08-10: 500 ug via INTRACAVERNOUS
  Filled 2020-08-10 (×2): qty 50

## 2020-08-10 MED ORDER — LABETALOL HCL 5 MG/ML IV SOLN
10.0000 mg | Freq: Once | INTRAVENOUS | Status: AC
  Administered 2020-08-10: 10 mg via INTRAVENOUS
  Filled 2020-08-10: qty 4

## 2020-08-10 MED ORDER — OXYCODONE-ACETAMINOPHEN 5-325 MG PO TABS
1.0000 | ORAL_TABLET | ORAL | Status: DC | PRN
  Administered 2020-08-10: 1 via ORAL
  Filled 2020-08-10: qty 1

## 2020-08-10 MED ORDER — LIDOCAINE-EPINEPHRINE 2 %-1:100000 IJ SOLN
20.0000 mL | Freq: Once | INTRAMUSCULAR | Status: DC
  Filled 2020-08-10: qty 1

## 2020-08-10 MED ORDER — MORPHINE SULFATE (PF) 4 MG/ML IV SOLN
4.0000 mg | Freq: Once | INTRAVENOUS | Status: AC
  Administered 2020-08-10: 4 mg via INTRAVENOUS
  Filled 2020-08-10: qty 1

## 2020-08-10 MED ORDER — AMLODIPINE BESYLATE 5 MG PO TABS
5.0000 mg | ORAL_TABLET | Freq: Once | ORAL | Status: AC
  Administered 2020-08-10: 5 mg via ORAL
  Filled 2020-08-10: qty 1

## 2020-08-10 NOTE — Discharge Instructions (Signed)
Please ensure that you are taking your amlodipine at home.  Please take Tylenol and ibuprofen/Advil for your pain.  It is safe to take them together, or to alternate them every few hours.  Take up to 1000mg  of Tylenol at a time, up to 4 times per day.  Do not take more than 4000 mg of Tylenol in 24 hours.  For ibuprofen, take 400-600 mg, 4-5 times per day.

## 2020-08-10 NOTE — ED Notes (Signed)
Urology at bedside.

## 2020-08-10 NOTE — ED Provider Notes (Signed)
[facility] Emergency Department Provider Note ____________________________________________   Event Date/Time   First MD Initiated Contact with Patient 08/10/20 1059     (approximate)  I have reviewed the triage vital signs and the nursing notes.  HISTORY  Chief Complaint Penis Pain   HPI Justin [name] is a 25 y.o. 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   Patient seen in our ED 3 weeks ago for similar presentation of priapism.  Urology was consulted and looks like they placed phenylephrine with improvement, and he was discharged thereafter.  Hypertensive on presentation requiring IV hydralazine then.  Patient presents to the ED for evaluation of 7 hours of penile pain and priapism.  He reports waking up in the middle the night, at 4 AM, with priapism and pain.  He reports continued symptoms despite a warm bath and frequent ambulation and deep breathing exercises.  Has not taken his amlodipine in 2-3 days.  He reports voiding this morning without dysuria.  Past Medical History:  Diagnosis Date   Asthma    Priapism     There are no problems to display for this patient.   History reviewed. No pertinent surgical history.  Prior to Admission medications   Medication Sig Start Date End Date Taking? Authorizing Provider  albuterol (VENTOLIN HFA) 108 (90 Base) MCG/ACT inhaler Inhale 2 puffs into the lungs every 4 (four) hours as needed for wheezing. 07/15/19   [provider]  amLODipine (NORVASC) 5 MG tablet Take 1 tablet (5 mg total) by mouth daily. 04/21/20 06/20/20  Chesley Noon, MD  fluticasone (FLONASE) 50 MCG/ACT nasal spray Place 1 spray into both nostrils daily. 04/21/20 04/21/21  Chesley Noon, MD    Allergies Other  No family history on file.  Social History Social History   Tobacco Use   Smoking status: Current Every Day Smoker    Packs/day: 0.50     Types: Cigarettes   Smokeless tobacco: Never Used  Vaping Use   Vaping Use: Never used  Substance Use Topics   Alcohol use: Yes   Drug use: Yes    Types: Marijuana    Review of Systems  Constitutional: No fever/chills Eyes: No visual changes. ENT: No sore throat. Cardiovascular: Denies chest pain. Respiratory: Denies shortness of breath. Gastrointestinal: No abdominal pain.  No nausea, no vomiting.  No diarrhea.  No constipation. Genitourinary: Negative for dysuria.  Positive for priapism. Musculoskeletal: Negative for back pain. Skin: Negative for rash. Neurological: Negative for headaches, focal weakness or numbness.  ____________________________________________   PHYSICAL EXAM:  VITAL SIGNS: Vitals:   08/10/20 1249 08/10/20 1300  BP: (!) 214/137 (!) 187/117  Pulse:    Resp:    Temp:    SpO2:       Constitutional: Alert and oriented. Well appearing and in no acute distress. Eyes: Conjunctivae are normal. PERRL. EOMI. Head: Atraumatic. Nose: No congestion/rhinnorhea. Mouth/Throat: Mucous membranes are moist.  Oropharynx non-erythematous. Neck: No stridor. No cervical spine tenderness to palpation. Cardiovascular: Normal rate, regular rhythm. Grossly normal heart sounds.  Good peripheral circulation. Respiratory: Normal respiratory effort.  No retractions. Lungs CTAB. Gastrointestinal: Soft , nondistended, nontender to palpation. No CVA tenderness. GU: Previous and without signs of ischemia.  No inguinal masses or tenderness.  No testicular tenderness or masses.  No scrotal skin changes. Musculoskeletal: No lower extremity tenderness nor edema.  No joint effusions. No signs of acute trauma. Neurologic:  Normal speech  and language. No gross focal neurologic deficits are appreciated. No gait instability noted. Skin:  Skin is warm, dry and intact. No rash noted. Psychiatric: Mood and affect are normal. Speech and behavior are  normal.  ____________________________________________   LABS (all labs ordered are listed, but only abnormal results are displayed)  Labs Reviewed - No data to display  ____________________________________________   PROCEDURES and INTERVENTIONS  Procedure(s) performed (including Critical Care):  Procedures  Medications  oxyCODONE-acetaminophen (PERCOCET/ROXICET) 5-325 MG per tablet 1 tablet (1 tablet Oral Given 08/10/20 1057)  lidocaine-EPINEPHrine (XYLOCAINE W/EPI) 2 %-1:100000 (with pres) injection 20 mL (20 mLs Infiltration Not Given 08/10/20 1236)  labetalol (NORMODYNE) injection 10 mg (10 mg Intravenous Given 08/10/20 1118)  amLODipine (NORVASC) tablet 5 mg (5 mg Oral Given 08/10/20 1143)  morphine 4 MG/ML injection 4 mg (4 mg Intravenous Given 08/10/20 1141)  pseudoephedrine (SUDAFED) tablet 30 mg (30 mg Oral Given 08/10/20 1232)  phenylephrine 200 mcg / ml CONC. DILUTION INJ (ED / Urology USE ONLY) (500 mcg Intracavernosal Given by Other 08/10/20 1233)    ____________________________________________   MDM / ED COURSE   25 year old male with history of HTN and priapism presents to the ED with symptoms of acute preop is him, requiring urologic assistance with phenylephrine administration, and ultimately amenable to outpatient management.  He presents quite hypertensive with systolics in the 200s after not taking his BP medications today, otherwise normal vitals on room air.  Exam demonstrates a well-appearing patient with nonischemic priapism.  He otherwise looks well without distress, neurovascular deficits or any trauma.  No history of sickle cell disease to suggest ischemic priapism.  I discussed the case with Dr. Apolinar Junes, who agrees to come to the ED to assist with management.  Patient has resolution of symptoms after phenylephrine administration by her.  He was provided IV labetalol due to his presenting blood pressure, and then provided his home dose of Norvasc.  He has no chest  pain, syncope, headache to suggest symptoms of endorgan damage such as ACS, SAH.  Shortly after phenylephrine administration, he is requesting discharge, to think is reasonable considering his lack of symptoms at this point.  BP recheck improving and I see no barriers to outpatient management at this time.  We discussed adherence to his medication regimen, following up with urology, and we discussed return precautions for the ED.  Clinical Course as of 08/10/20 1518  Sat Aug 10, 2020  1122 I discuss the case with Dr. Vanna Scotland, she agrees to come into the ED to assist with penile block and aspiration.  She requests provision of Sudafed and having phenylephrine at the bedside of her particular concentration.  I called the pharmacy to facilitate this. [DS]  1241 Reassessed. Pt reports feeling much better and back to baseline. Urology injected phenylephrine without aspirating blood.  He has voided afterwards without difficulty or pain.  We discussed his antihypertensive regimen, and he reports having his amlodipine at home, only missing 1 dose yesterday.  We discussed recheck of his BP and likely outpatient management, he is agreeable.  We discussed following up with urology as an outpatient. [DS]    Clinical Course User Index [DS] Delton Prairie, MD    ____________________________________________   FINAL CLINICAL IMPRESSION(S) / ED DIAGNOSES  Final diagnoses:  Priapism  Primary hypertension     ED Discharge Orders    None       Alise Calais Katrinka Blazing   Note:  This document was prepared using Dragon voice recognition software and  may include unintentional dictation errors.   Delton Prairie, MD 08/10/20 [phone]

## 2020-08-10 NOTE — Consult Note (Signed)
Urology Consult  I have been asked to see the patient by Dr. Katrinka Blazing, for evaluation and management of priasm.  Chief Complaint: painful erection  History of Present Illness: Justin [name] is a 25 y.o. year old male with a personal history of recurrent priapism presenting to the ER with a painful erection since 4 AM.  He reports that he missed a dose of his blood pressure medication yesterday.  He also smokes marijuana daily.  He woke up with a painful erection which failed to improve.  He reports that this happens every so often and normally he is able to walk around a little bit and get the erection to come down.  He was not successful today and thus presented to the emergency room.  I was called around 11:20 AM this morning to assist with management of priapism.   Notably, he was recently in the emergency room about 2 or 3 weeks ago with my partner Dr. Richardo Hanks and responded quite nicely to phenylephrine irrigation and aspiration.  Past Medical History:  Diagnosis Date  . Asthma   . Priapism     History reviewed. No pertinent surgical history.  Home Medications:  No outpatient medications have been marked as taking for the 08/10/20 encounter [facility] Encounter).    Allergies:  Allergies  Allergen Reactions  . Other Hives    Sulfate    No family history on file.  Social History:  reports that he has been smoking cigarettes. He has been smoking about 0.50 packs per day. He has never used smokeless tobacco. He reports current alcohol use. He reports current drug use. Drug: Marijuana.  ROS: A complete review of systems was performed.  All systems are negative except for pertinent findings as noted.  Physical Exam:  Vital signs in last 24 hours: Temp:  [98.1 F (36.7 C)] 98.1 F (36.7 C) (03/12 1049) Pulse Rate:  [61-99] 66 (03/12 1224) Resp:  [16-17] 16 (03/12 1215) BP: (161-219)/(100-140) 187/117 (03/12 1300) SpO2:  [94 %-98 %] 94 % (03/12 1224) Weight:  [117.9  kg] 117.9 kg (03/12 1050) Constitutional:  Alert and oriented, No acute distress HEENT: [place] AT, moist mucus membranes.  Trachea midline, no masses Cardiovascular: Regular rate and rhythm, no clubbing, cyanosis, or edema. Respiratory: Normal respiratory effort, lungs clear bilaterally GI: Abdomen is soft, nontender, nondistended, no abdominal masses GU: Fully erect rigid penis Neurologic: Grossly intact, no focal deficits, moving all 4 extremities Psychiatric: Normal mood and affect  Procedure: Given the patient's personal history of recurrent ischemic priapism and fairly good response to phenylephrine, I elected to simply inject the patient as first-line therapy with phenylephrine today rather than aspiration irrigation to help minimize penile trauma.  Patient was monitored throughout the entirety of the procedure today including blood pressure and heart rate.  I first cleaned the left midshaft of the penis and injected 300 mcg of phenylephrine.  After about 3 minutes, his 100% erection improved to about a 70% erection.  We did a full 5 minutes.  His erection remained stable at this point.  I injected the contralateral side with additional 200 mcg of phenylephrine for total of 500 mcg altogether or 0.5 mg..  After the second injection, he achieved nearly full detumescence and a quick resolution of his pain.  He remained flaccid for another 5 minutes before he departed.  Impression/ Plan: 26 year old male with personal history of recurrent ischemic priapism status post successful detumescence.  -Recommend outpatient follow-up as previously recommended -Counseled  patient to abstain from marijuana ensure good blood pressure control as these may be contributing factors -Recurrent priapism precautions discussed -We will go ahead and give a dose of Sudafed before he leaves to try to prevent any recurrence today  Vanna Scotland, MD

## 2020-08-10 NOTE — ED Notes (Signed)
Dr. Zakyla Tonche at bedside.

## 2020-08-10 NOTE — ED Triage Notes (Signed)
Pt in via POV, complains of waking up approximately 0400 w/ priapism.  Reports trying measures at home such as walking, taking a warm bath without any relief.    BP elevated, states has not taken BP meds in 2 days.  Appears uncomfortable.

## 2020-08-15 ENCOUNTER — Ambulatory Visit: Payer: Self-pay | Admitting: Urology

## 2020-08-26 ENCOUNTER — Encounter: Payer: Self-pay | Admitting: Urology

## 2020-08-26 ENCOUNTER — Ambulatory Visit: Payer: Self-pay | Admitting: Urology

## 2020-10-10 ENCOUNTER — Emergency Department
Admission: EM | Admit: 2020-10-10 | Discharge: 2020-10-10 | Disposition: A | Discharge status: Home / Self Care | Payer: Medicaid Other | Attending: Student in an Organized Health Care Education/Training Program | Admitting: Student in an Organized Health Care Education/Training Program

## 2020-10-10 ENCOUNTER — Other Ambulatory Visit: Payer: Self-pay

## 2020-10-10 ENCOUNTER — Emergency Department: Payer: Medicaid Other

## 2020-10-10 ENCOUNTER — Encounter: Payer: Self-pay | Admitting: Emergency Medicine

## 2020-10-10 DIAGNOSIS — X501XXA Overexertion from prolonged static or awkward postures, initial encounter: Secondary | ICD-10-CM | POA: Insufficient documentation

## 2020-10-10 DIAGNOSIS — Z91199 Patient's noncompliance with other medical treatment and regimen due to unspecified reason: Secondary | ICD-10-CM

## 2020-10-10 DIAGNOSIS — I10 Essential (primary) hypertension: Secondary | ICD-10-CM | POA: Insufficient documentation

## 2020-10-10 DIAGNOSIS — F17210 Nicotine dependence, cigarettes, uncomplicated: Secondary | ICD-10-CM | POA: Insufficient documentation

## 2020-10-10 DIAGNOSIS — J45909 Unspecified asthma, uncomplicated: Secondary | ICD-10-CM | POA: Insufficient documentation

## 2020-10-10 DIAGNOSIS — S93602A Unspecified sprain of left foot, initial encounter: Secondary | ICD-10-CM | POA: Insufficient documentation

## 2020-10-10 DIAGNOSIS — Z9119 Patient's noncompliance with other medical treatment and regimen: Secondary | ICD-10-CM | POA: Insufficient documentation

## 2020-10-10 DIAGNOSIS — Z7951 Long term (current) use of inhaled steroids: Secondary | ICD-10-CM | POA: Insufficient documentation

## 2020-10-10 DIAGNOSIS — Y9367 Activity, basketball: Secondary | ICD-10-CM | POA: Insufficient documentation

## 2020-10-10 HISTORY — DX: Essential (primary) hypertension: I10

## 2020-10-10 NOTE — Discharge Instructions (Addendum)
Ice and elevate your foot today and tomorrow.  Take your blood pressure medication that you have at home as your blood pressure is elevated.  Blood pressure medication needs to be taken daily to avoid stroke or heart attack.  You will also need to make an appointment with your primary care provider to have your blood pressure rechecked to see if change in blood pressure medication is needed.  Wear Ace wrap and orthopedic shoe for protection and support.  If any continued problems with your foot you will need to follow-up with Dr. Okey Dupre who is the orthopedist on-call.

## 2020-10-10 NOTE — ED Triage Notes (Signed)
Pt comes into the ED via POV c/o left foot pain after twisting it while playing basketball.  Pt ambulatory to exam room at this time and in NAD.  No deformity noted to the foot.

## 2020-10-10 NOTE — ED Provider Notes (Signed)
At  [facility] Emergency Department Provider Note  ____________________________________________   Event Date/Time   First MD Initiated Contact with Patient 10/10/20 1054     (approximate)  I have reviewed the triage vital signs and the nursing notes.   HISTORY  Chief Complaint Foot Pain   HPI Justin [name] is a 25 y.o. male presents to the ED with complaint of left foot pain since yesterday.  Patient states that he twisted his foot while playing basketball and has had pain since.  He denies taking any over-the-counter medication and has been ambulatory since the injury.  He denies any previous fracture in this area.  Patient has a history of hypertension and has not taken his blood pressure medication in over a month.  He rates pain as 6 out of 10.        Past Medical History:  Diagnosis Date  . Asthma   . Hypertension   . Priapism     There are no problems to display for this patient.   History reviewed. No pertinent surgical history.  Prior to Admission medications   Medication Sig Start Date End Date Taking? Authorizing Provider  albuterol (VENTOLIN HFA) 108 (90 Base) MCG/ACT inhaler Inhale 2 puffs into the lungs every 4 (four) hours as needed for wheezing. 07/15/19   [provider]  amLODipine (NORVASC) 5 MG tablet Take 1 tablet (5 mg total) by mouth daily. 04/21/20 06/20/20  Chesley Noon, MD  fluticasone (FLONASE) 50 MCG/ACT nasal spray Place 1 spray into both nostrils daily. 04/21/20 04/21/21  Chesley Noon, MD    Allergies Other  History reviewed. No pertinent family history.  Social History Social History   Tobacco Use  . Smoking status: Current Every Day Smoker    Packs/day: 0.50    Types: Cigarettes  . Smokeless tobacco: Never Used  Vaping Use  . Vaping Use: Never used  Substance Use Topics  . Alcohol use: Yes  . Drug use: Yes    Types: Marijuana    Review of Systems Constitutional: No  fever/chills Eyes: No visual changes. Cardiovascular: Denies chest pain. Respiratory: Denies shortness of breath. Gastrointestinal: No abdominal pain.  No nausea, no vomiting.  Musculoskeletal: Positive for left foot and ankle pain. Skin: Negative for rash. Neurological: Negative for headaches, focal weakness or numbness. ____________________________________________   PHYSICAL EXAM:  VITAL SIGNS: ED Triage Vitals  Enc Vitals Group     BP 10/10/20 1056 (S) (!) 172/113     Pulse Rate 10/10/20 1056 86     Resp 10/10/20 1056 17     Temp 10/10/20 1056 97.8 F (36.6 C)     Temp Source 10/10/20 1056 Oral     SpO2 10/10/20 1056 96 %     Weight 10/10/20 1055 260 lb (117.9 kg)     Height 10/10/20 1055 6\' 1"  (1.854 m)     Head Circumference --      Peak Flow --      Pain Score 10/10/20 1054 6     Pain Loc --      Pain Edu? --      Excl. in GC? --     Constitutional: Alert and oriented. Well appearing and in no acute distress. Eyes: Conjunctivae are normal.  Head: Atraumatic. Neck: No stridor.   Cardiovascular: Normal rate, regular rhythm. Grossly normal heart sounds.  Good peripheral circulation. Respiratory: Normal respiratory effort.  No retractions. Lungs CTAB. Musculoskeletal: Examination of the left foot there is no gross deformity  however there is some soft tissue edema noted on the dorsal aspect.  There is some tenderness on palpation of the proximal metatarsal area.  Skin is intact.  Capillary refill is less than 3 seconds.  Motor sensory function intact.  Dorsalis pedis pulse present. Neurologic:  Normal speech and language. No gross focal neurologic deficits are appreciated.  Skin:  Skin is warm, dry and intact.  Psychiatric: Mood and affect are normal. Speech and behavior are normal.  ____________________________________________   LABS (all labs ordered are listed, but only abnormal results are displayed)  Labs Reviewed - No data to  display ____________________________________________  RADIOLOGY I, Tommi Rumps, personally viewed and evaluated these images (plain radiographs) as part of my medical decision making, as well as reviewing the written report by the radiologist.   Official radiology report(s): DG Foot Complete Left  Result Date: 10/10/2020 CLINICAL DATA:  Foot pain after playing basketball EXAM: LEFT FOOT - COMPLETE 3+ VIEW COMPARISON:  None. FINDINGS: There is no evidence of fracture or dislocation. Mild degenerative changes are seen in the dorsal aspect of the midfoot. The soft tissues are unremarkable. IMPRESSION: No acute osseous injury. Electronically Signed   By: Romona Curls M.D.   On: 10/10/2020 11:56    ____________________________________________   PROCEDURES  Procedure(s) performed (including Critical Care):  Procedures   ____________________________________________   INITIAL IMPRESSION / ASSESSMENT AND PLAN / ED COURSE  As part of my medical decision making, I reviewed the following data within the electronic MEDICAL RECORD NUMBER Notes from prior ED visits and [place] Controlled Substance Database   25 year old male presents to the ED with complaint of foot pain after twisting it playing basketball yesterday.  Patient was able to ambulate yesterday and today with pain.  In triage it was noted that his blood pressure was elevated at 172/113.  Patient states that he has blood pressure medication at home and probably has not taken it in over 1 month.  We discussed the increased risk for stroke and heart attacks with his uncontrolled hypertension.  Mother was present during this discussion and agrees that patient should be taking his blood pressure medication every day.  He denies any shortness of breath, chest pain, dizziness or headache at this time.  Patient was discharged with an Ace wrap and an orthopedic shoe for protection and support.  Patient will take over-the-counter medication as needed  for foot pain.  He is strongly encouraged to take his blood pressure medication when he gets home.  ____________________________________________   FINAL CLINICAL IMPRESSION(S) / ED DIAGNOSES  Final diagnoses:  Foot sprain, left, initial encounter  Hypertension, uncontrolled  Medically noncompliant     ED Discharge Orders    None      *Please note:  Justin [name] was evaluated in Emergency Department on 10/10/2020 for the symptoms described in the history of present illness. He was evaluated in the context of the global COVID-19 pandemic, which necessitated consideration that the patient might be at risk for infection with the SARS-CoV-2 virus that causes COVID-19. Institutional protocols and algorithms that pertain to the evaluation of patients at risk for COVID-19 are in a state of rapid change based on information released by regulatory bodies including the CDC and federal and state organizations. These policies and algorithms were followed during the patient's care in the ED.  Some ED evaluations and interventions may be delayed as a result of limited staffing during and the pandemic.*   Note:  This document was prepared  using Conservation officer, historic buildings and may include unintentional dictation errors.    Tommi Rumps, PA-C 10/10/20 1442    Willy Eddy, MD 10/10/20 1447

## 2020-10-10 NOTE — ED Notes (Signed)
Ace wrap and post op boot applied to left foot with no issue. Pt tolerated well and ambulated to lobby with family member.

## 2021-03-19 ENCOUNTER — Other Ambulatory Visit: Payer: Self-pay | Admitting: Student

## 2021-03-19 ENCOUNTER — Ambulatory Visit
Admission: RE | Admit: 2021-03-19 | Discharge: 2021-03-19 | Disposition: A | Discharge status: Home / Self Care | Payer: Medicaid Other | Source: Ambulatory Visit | Attending: Student | Admitting: Student

## 2021-03-19 DIAGNOSIS — M25571 Pain in right ankle and joints of right foot: Secondary | ICD-10-CM

## 2021-08-19 ENCOUNTER — Encounter: Payer: Self-pay | Admitting: *Deleted

## 2021-08-19 ENCOUNTER — Other Ambulatory Visit: Payer: Self-pay

## 2021-08-19 ENCOUNTER — Emergency Department
Admission: EM | Admit: 2021-08-19 | Discharge: 2021-08-19 | Disposition: A | Discharge status: Home / Self Care | Payer: Medicaid Other | Attending: Emergency Medicine | Admitting: Emergency Medicine

## 2021-08-19 ENCOUNTER — Emergency Department: Payer: Medicaid Other

## 2021-08-19 DIAGNOSIS — M545 Low back pain, unspecified: Secondary | ICD-10-CM | POA: Insufficient documentation

## 2021-08-19 DIAGNOSIS — J45909 Unspecified asthma, uncomplicated: Secondary | ICD-10-CM | POA: Insufficient documentation

## 2021-08-19 DIAGNOSIS — R1110 Vomiting, unspecified: Secondary | ICD-10-CM | POA: Insufficient documentation

## 2021-08-19 DIAGNOSIS — I10 Essential (primary) hypertension: Secondary | ICD-10-CM | POA: Insufficient documentation

## 2021-08-19 DIAGNOSIS — R1011 Right upper quadrant pain: Secondary | ICD-10-CM | POA: Insufficient documentation

## 2021-08-19 LAB — CBC
HCT: 49.1 % (ref 39.0–52.0)
Hemoglobin: 16.4 g/dL (ref 13.0–17.0)
MCH: 28.9 pg (ref 26.0–34.0)
MCHC: 33.4 g/dL (ref 30.0–36.0)
MCV: 86.6 fL (ref 80.0–100.0)
Platelets: 277 10*3/uL (ref 150–400)
RBC: 5.67 MIL/uL (ref 4.22–5.81)
RDW: 12.2 % (ref 11.5–15.5)
WBC: 9.3 10*3/uL (ref 4.0–10.5)
nRBC: 0 % (ref 0.0–0.2)

## 2021-08-19 LAB — URINALYSIS, ROUTINE W REFLEX MICROSCOPIC
Bacteria, UA: NONE SEEN
Bilirubin Urine: NEGATIVE
Glucose, UA: NEGATIVE mg/dL
Hgb urine dipstick: NEGATIVE
Ketones, ur: NEGATIVE mg/dL
Leukocytes,Ua: NEGATIVE
Nitrite: NEGATIVE
Protein, ur: 30 mg/dL — AB
Specific Gravity, Urine: 1.024 (ref 1.005–1.030)
pH: 7 (ref 5.0–8.0)

## 2021-08-19 LAB — COMPREHENSIVE METABOLIC PANEL
ALT: 23 U/L (ref 0–44)
AST: 16 U/L (ref 15–41)
Albumin: 3.8 g/dL (ref 3.5–5.0)
Alkaline Phosphatase: 64 U/L (ref 38–126)
Anion gap: 7 (ref 5–15)
BUN: 17 mg/dL (ref 6–20)
CO2: 30 mmol/L (ref 22–32)
Calcium: 8.7 mg/dL — ABNORMAL LOW (ref 8.9–10.3)
Chloride: 101 mmol/L (ref 98–111)
Creatinine, Ser: 1.23 mg/dL (ref 0.61–1.24)
GFR, Estimated: >60 mL/min (ref >60)
Glucose, Bld: 94 mg/dL (ref 70–99)
Potassium: 3.9 mmol/L (ref 3.5–5.1)
Sodium: 138 mmol/L (ref 135–145)
Total Bilirubin: 1 mg/dL (ref 0.3–1.2)
Total Protein: 7.6 g/dL (ref 6.5–8.1)

## 2021-08-19 LAB — LIPASE, BLOOD: Lipase: 32 U/L (ref 11–51)

## 2021-08-19 MED ORDER — AMLODIPINE BESYLATE 5 MG PO TABS: 5.0000 mg | ORAL_TABLET | Freq: Every day | ORAL | 3 refills | Status: DC

## 2021-08-19 MED ORDER — LIDOCAINE VISCOUS HCL 2 % MT SOLN
15.0000 mL | Freq: Once | OROMUCOSAL | Status: AC
  Administered 2021-08-19: 15 mL via ORAL
  Filled 2021-08-19: qty 15

## 2021-08-19 MED ORDER — CLONIDINE HCL 0.1 MG PO TABS
0.2000 mg | ORAL_TABLET | Freq: Once | ORAL | Status: AC
  Administered 2021-08-19: 0.2 mg via ORAL
  Filled 2021-08-19: qty 2

## 2021-08-19 MED ORDER — PANTOPRAZOLE SODIUM 40 MG PO TBEC: 40.0000 mg | DELAYED_RELEASE_TABLET | Freq: Every day | ORAL | 1 refills | Status: AC

## 2021-08-19 MED ORDER — ALUM & MAG HYDROXIDE-SIMETH 200-200-20 MG/5ML PO SUSP
30.0000 mL | Freq: Once | ORAL | Status: AC
  Administered 2021-08-19: 30 mL via ORAL
  Filled 2021-08-19: qty 30

## 2021-08-19 NOTE — Discharge Instructions (Addendum)
Follow-up with your regular doctor for recheck of your blood pressure.  Return emergency department if worse ?

## 2021-08-19 NOTE — ED Provider Notes (Signed)
? ?[facility] ?Provider Note ? ? ? Event Date/Time  ? First MD Initiated Contact with Patient 08/19/21 1611   ?  (approximate) ? ? ?History  ? ?Abdominal Pain ? ? ?HPI ? ?Justin [name] is a 26 y.o. male with history of hypertension and asthma presents emergency department with complaints of right upper quadrant pain.  Pain is worse after eating.  Had 1 episode of vomiting but no diarrhea.  Some lower back pain.  No numbness or tingling.  Patient still has his gallbladder.  Admits to eating a lot of spicy foods along with drinking alcohol. ? ?  ? ? ?Physical Exam  ? ?Triage Vital Signs: ?ED Triage Vitals  ?Enc Vitals Group  ?   BP 08/19/21 1534 (!) 201/141  ?   Pulse Rate 08/19/21 1534 76  ?   Resp 08/19/21 1534 18  ?   Temp 08/19/21 1534 98.2 ?F (36.8 ?C)  ?   Temp Source 08/19/21 1534 Oral  ?   SpO2 08/19/21 1534 97 %  ?   Weight 08/19/21 1538 255 lb (115.7 kg)  ?   Height 08/19/21 1538 6\' 1"  (1.854 m)  ?   Head Circumference --   ?   Peak Flow --   ?   Pain Score 08/19/21 1538 5  ?   Pain Loc --   ?   Pain Edu? --   ?   Excl. in GC? --   ? ? ?Most recent vital signs: ?Vitals:  ? 08/19/21 1800 08/19/21 1826  ?BP: (!) 186/124 (!) 180/123  ?Pulse: 65   ?Resp:    ?Temp:    ?SpO2: 97%   ? ? ? ?General: Awake, no distress.   ?CV:  Good peripheral perfusion. regular rate and  rhythm ?Resp:  Normal effort. Lungs CTA ?Abd:  No distention.  Tender in right upper quadrant ?Other:    ? ? ?ED Results / Procedures / Treatments  ? ?Labs ?(all labs ordered are listed, but only abnormal results are displayed) ?Labs Reviewed  ?COMPREHENSIVE METABOLIC PANEL - Abnormal; Notable for the following components:  ?    Result Value  ? Calcium 8.7 (*)   ? All other components within normal limits  ?URINALYSIS, ROUTINE W REFLEX MICROSCOPIC - Abnormal; Notable for the following components:  ? Color, Urine YELLOW (*)   ? APPearance HAZY (*)   ? Protein, ur 30 (*)   ? All other components within normal limits  ?LIPASE,  BLOOD  ?CBC  ? ? ? ?EKG ? ? ? ? ?RADIOLOGY ? ?Ultrasound right upper quadrant ? ?PROCEDURES: ? ? ?Procedures ? ? ?MEDICATIONS ORDERED IN ED: ?Medications  ?alum & mag hydroxide-simeth (MAALOX/MYLANTA) 200-200-20 MG/5ML suspension 30 mL (30 mLs Oral Given 08/19/21 1717)  ?  And  ?lidocaine (XYLOCAINE) 2 % viscous mouth solution 15 mL (15 mLs Oral Given 08/19/21 1717)  ?cloNIDine (CATAPRES) tablet 0.2 mg (0.2 mg Oral Given 08/19/21 1745)  ? ? ? ?IMPRESSION / MDM / ASSESSMENT AND PLAN / ED COURSE  ?I reviewed the triage vital signs and the nursing notes. ?             ?               ? ?Differential diagnosis includes, but is not limited to, acute cholecystitis, peptic ulcer disease, pancreatitis ? ?Patient's labs are reassuring, CBC, metabolic panel and urinalysis are all normal, lipase is normal ? ?Due to the patient's tenderness in the right  upper quadrant upon palpation will order ultrasound right upper quadrant ? ?Ultrasound right upper quadrant was independently reviewed by me.  Read as negative by radiologist ? ?Patient's blood pressure remains high.  He was given a GI cocktail to see if this helps with abdominal pain.  Patient had a complete relief with the GI cocktail.  Therefore we will place him on Protonix 40 mg daily.  We did discuss diet changes.  Patient's wife is getting ready to have a baby so he is under a great deal of stress.  Also we gave him clonidine 0.2 mg to decrease his blood pressure.  His blood pressure had lowered to the level it was 1 year ago in the ED.  I did send in a prescription for the amlodipine that he was taking previously.  He is to take this medication and follow-up with his PCP for a recheck of his blood pressure in 1 week.  Return emergency department if he is worsening.  Patient is in agreement with his treatment plan.  He was discharged stable condition. ? ? ? ?  ? ? ?FINAL CLINICAL IMPRESSION(S) / ED DIAGNOSES  ? ?Final diagnoses:  ?Abdominal pain, RUQ (right upper quadrant)   ?Hypertension, unspecified type  ? ? ? ?Rx / DC Orders  ? ?ED Discharge Orders   ? ?      Ordered  ?  pantoprazole (PROTONIX) 40 MG tablet  Daily       ? 08/19/21 1708  ?  amLODipine (NORVASC) 5 MG tablet  Daily       ? 08/19/21 1735  ? ?  ?  ? ?  ? ? ? ?Note:  This document was prepared using Dragon voice recognition software and may include unintentional dictation errors. ? ?  ?Faythe Ghee, PA-C ?08/19/21 1832 ? ?  ?Concha Se, MD ?08/20/21 1810 ? ?

## 2021-08-19 NOTE — ED Triage Notes (Addendum)
Pt has abd pain for 3 days.  Vomited x 1   no diarrhea.  Pt reports lower back pain.  No urinary sx.  Pt alert  speech clear.  Hx hypertension, no meds for years per pt.   ?

## 2021-10-28 ENCOUNTER — Other Ambulatory Visit: Payer: Self-pay

## 2021-10-28 DIAGNOSIS — W228XXA Striking against or struck by other objects, initial encounter: Secondary | ICD-10-CM | POA: Insufficient documentation

## 2021-10-28 DIAGNOSIS — I10 Essential (primary) hypertension: Secondary | ICD-10-CM | POA: Insufficient documentation

## 2021-10-28 DIAGNOSIS — S0501XA Injury of conjunctiva and corneal abrasion without foreign body, right eye, initial encounter: Secondary | ICD-10-CM | POA: Insufficient documentation

## 2021-10-28 DIAGNOSIS — J45909 Unspecified asthma, uncomplicated: Secondary | ICD-10-CM | POA: Insufficient documentation

## 2021-10-28 DIAGNOSIS — Y990 Civilian activity done for income or pay: Secondary | ICD-10-CM | POA: Insufficient documentation

## 2021-10-28 NOTE — ED Triage Notes (Signed)
Pt arrives with right eye pain. Per pt, he thinks he has a foreign body in his eye. Pt unsure if its metal. Pt denies blurry vision in right eye.

## 2021-10-28 NOTE — ED Provider Triage Note (Signed)
Emergency Medicine Provider Triage Evaluation Note  Justin [name] , a 26 y.o. male  was evaluated in triage.  Pt complains of having a piece of metal in his eye. 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 Vision is normal, but hurts to keep the eye open.   Review of Systems  Positive: Right eye pain Negative: Vision changes.  Physical Exam  There were no vitals taken for this visit. Gen:   Awake, no distress   Resp:  Normal effort  MSK:   Moves extremities without difficulty  Other:    Medical Decision Making  Medically screening exam initiated at 8:29 PM.  Appropriate orders placed.  AADYN BUCHHEIT was informed that the remainder of the evaluation will be completed by another provider, this initial triage assessment does not replace that evaluation, and the importance of remaining in the ED until their evaluation is complete.   Chinita Pester, FNP 10/28/21 2032

## 2021-10-29 ENCOUNTER — Emergency Department
Admission: EM | Admit: 2021-10-29 | Discharge: 2021-10-29 | Disposition: A | Discharge status: Home / Self Care | Payer: Medicaid Other | Attending: Emergency Medicine | Admitting: Emergency Medicine

## 2021-10-29 DIAGNOSIS — S0501XA Injury of conjunctiva and corneal abrasion without foreign body, right eye, initial encounter: Secondary | ICD-10-CM

## 2021-10-29 MED ORDER — ERYTHROMYCIN 5 MG/GM OP OINT: 1.0000 "application " | TOPICAL_OINTMENT | Freq: Four times a day (QID) | OPHTHALMIC | 0 refills | Status: AC

## 2021-10-29 MED ORDER — TETRACAINE HCL 0.5 % OP SOLN
1.0000 [drp] | Freq: Once | OPHTHALMIC | Status: AC
  Administered 2021-10-29: 1 [drp] via OPHTHALMIC

## 2021-10-29 MED ORDER — FLUORESCEIN SODIUM 1 MG OP STRP
1.0000 | ORAL_STRIP | Freq: Once | OPHTHALMIC | Status: AC
Start: 2021-10-29 — End: 2021-10-29
  Administered 2021-10-29: 1 via OPHTHALMIC

## 2021-10-29 NOTE — ED Provider Notes (Signed)
[facility] Provider Note    Event Date/Time   First MD Initiated Contact with Patient 10/29/21 [phone]     (approximate)   History   Chief Complaint Foreign Body in Eye   HPI  Justin [name] is a 26 y.o. male with past medical history of hypertension and asthma who presents to the ED complaining of eye pain.  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  He states that he wears safety goggles but thinks something may have gotten up under the goggles.  He has had significant redness and tearing from the eye since then, states it is uncomfortable for him to open the eye, but when he does so his vision is intact.  He does not wear glasses or contacts.     Physical Exam   Triage Vital Signs: ED Triage Vitals  Enc Vitals Group     BP 10/28/21 2032 (!) 179/126     Pulse Rate 10/28/21 2032 71     Resp 10/28/21 2032 18     Temp 10/28/21 2032 98.7 F (37.1 C)     Temp Source 10/28/21 2032 Oral     SpO2 10/28/21 2032 95 %     Weight 10/28/21 2031 260 lb (117.9 kg)     Height --      Head Circumference --      Peak Flow --      Pain Score 10/28/21 2031 9     Pain Loc --      Pain Edu? --      Excl. in GC? --     Most recent vital signs: Vitals:   10/28/21 2032  BP: (!) 179/126  Pulse: 71  Resp: 18  Temp: 98.7 F (37.1 C)  SpO2: 95%    Constitutional: Alert and oriented. Eyes: Right conjunctival injection with clear drainage noted.  No foreign bodies noted with eversion of lid.  Fluorescein uptake noted and right upper quadrant of cornea. Head: Atraumatic. Nose: No congestion/rhinnorhea. Mouth/Throat: Mucous membranes are moist.  Cardiovascular: Normal rate, regular rhythm. Grossly normal heart sounds.  2+ radial pulses bilaterally. Respiratory: Normal respiratory effort.  No retractions. Lungs CTAB. Gastrointestinal: Soft and  nontender. No distention. Musculoskeletal: No lower extremity tenderness nor edema.  Neurologic:  Normal speech and language. No gross focal neurologic deficits are appreciated.    ED Results / Procedures / Treatments   Labs (all labs ordered are listed, but only abnormal results are displayed) Labs Reviewed - No data to display   PROCEDURES:  Critical Care performed: No  Procedures   MEDICATIONS ORDERED IN ED: Medications  fluorescein ophthalmic strip 1 strip (1 strip Both Eyes Given by Other 10/29/21 0140)  tetracaine (PONTOCAINE) 0.5 % ophthalmic solution 1 drop (1 drop Both Eyes Given by Other 10/29/21 0140)     IMPRESSION / MDM / ASSESSMENT AND PLAN / ED COURSE  I reviewed the triage vital signs and the nursing notes.                              26 y.o. male with past medical history of hypertension and asthma who presents to the ED complaining of redness and drainage from right eye starting while he was at work earlier today.  Patient's presentation is most consistent with acute complicated illness /  injury requiring diagnostic workup.  Differential diagnosis includes, but is not limited to, foreign body, corneal abrasion, conjunctivitis, uveitis.  Patient well-appearing and in no acute distress, vitals remarkable for elevated blood pressure.  Patient reports history of hypertension and typically takes medication for it, but has not taken his medication today.  No symptoms related to elevated blood pressure necessitating further work-up.  He may have had foreign body in his right eye at one time but none present on my examination.  He does have evidence of corneal abrasion and would benefit from prescription for erythromycin eye ointment.  He is appropriate for discharge home and was counseled to follow-up with ophthalmology, otherwise return to the ED for new or worsening symptoms.  Patient agrees with plan.      FINAL CLINICAL IMPRESSION(S) / ED DIAGNOSES   Final  diagnoses:  Abrasion of right cornea, initial encounter     Rx / DC Orders   ED Discharge Orders          Ordered    erythromycin ophthalmic ointment  4 times daily        10/29/21 0151             Note:  This document was prepared using Dragon voice recognition software and may include unintentional dictation errors.   Chesley Noon, MD 10/29/21 [phone]

## 2021-11-02 ENCOUNTER — Other Ambulatory Visit: Payer: Self-pay

## 2021-11-02 ENCOUNTER — Emergency Department
Admission: EM | Admit: 2021-11-02 | Discharge: 2021-11-02 | Disposition: A | Discharge status: Home / Self Care | Payer: Self-pay | Attending: Student in an Organized Health Care Education/Training Program | Admitting: Student in an Organized Health Care Education/Training Program

## 2021-11-02 ENCOUNTER — Encounter: Payer: Self-pay | Admitting: Emergency Medicine

## 2021-11-02 DIAGNOSIS — L03111 Cellulitis of right axilla: Secondary | ICD-10-CM | POA: Insufficient documentation

## 2021-11-02 DIAGNOSIS — L02411 Cutaneous abscess of right axilla: Secondary | ICD-10-CM | POA: Insufficient documentation

## 2021-11-02 DIAGNOSIS — L02419 Cutaneous abscess of limb, unspecified: Secondary | ICD-10-CM

## 2021-11-02 MED ORDER — LIDOCAINE-EPINEPHRINE 2 %-1:100000 IJ SOLN
20.0000 mL | Freq: Once | INTRAMUSCULAR | Status: AC
  Administered 2021-11-02: 20 mL via INTRADERMAL
  Filled 2021-11-02: qty 1

## 2021-11-02 MED ORDER — DOXYCYCLINE HYCLATE 100 MG PO CAPS: 100.0000 mg | ORAL_CAPSULE | Freq: Two times a day (BID) | ORAL | 0 refills | Status: AC

## 2021-11-02 MED ORDER — IBUPROFEN 600 MG PO TABS: 600.0000 mg | ORAL_TABLET | Freq: Three times a day (TID) | ORAL | 0 refills | Status: DC | PRN

## 2021-11-02 NOTE — ED Triage Notes (Signed)
Pt reports abscess under right arm for the past week.

## 2021-11-02 NOTE — ED Provider Notes (Signed)
[facility] Provider Note    Event Date/Time   First MD Initiated Contact with Patient 11/02/21 1005     (approximate)   History   Abscess   HPI  Justin [name] is a 26 y.o. male here with right adnexal pain.  The patient states that over the last week, he has had progressive worsening aching, throbbing, pain in his right adnexa with associated skin induration and swelling.  He has had a history of abscess and has required drainage before.  He states he feels similar to that.  He tried warm compresses and alcohol, without improvement.  He states the pain has been progressively worse.  Is worse with any Movement of his arm.  No known fevers.  No chills.  He is not immunosuppressed.     Physical Exam   Triage Vital Signs: ED Triage Vitals  Enc Vitals Group     BP 11/02/21 1002 (!) 167/115     Pulse Rate 11/02/21 1002 79     Resp 11/02/21 1002 16     Temp 11/02/21 1002 98.2 F (36.8 C)     Temp Source 11/02/21 1002 Oral     SpO2 11/02/21 1002 95 %     Weight 11/02/21 1001 250 lb (113.4 kg)     Height 11/02/21 1001 6\' 1"  (1.854 m)     Head Circumference --      Peak Flow --      Pain Score 11/02/21 1001 8     Pain Loc --      Pain Edu? --      Excl. in GC? --     Most recent vital signs: Vitals:   11/02/21 1002  BP: (!) 167/115  Pulse: 79  Resp: 16  Temp: 98.2 F (36.8 C)  SpO2: 95%     General: Awake, no distress.  CV:  Good peripheral perfusion.  Regular rate and rhythm. Resp:  Normal effort.  Lungs clear to auscultation bilaterally. Abd:  No distention.  Other:  Approximately 4 x 3 cm area of fluctuance and induration to the right anterior adnexa, with mild erythema.  No tenderness beyond the areas of erythema.   ED Results / Procedures / Treatments   Labs (all labs ordered are listed, but only abnormal results are displayed) Labs Reviewed - No data to display   EKG    RADIOLOGY     PROCEDURES:  Critical Care  performed: No  ..Incision and Drainage  Date/Time: 11/02/2021 11:07 AM Performed by: 01/02/2022, MD Authorized by: Shaune Pollack, MD   Consent:    Consent obtained:  Verbal   Consent given by:  Patient   Risks, benefits, and alternatives were discussed: yes     Risks discussed:  Incomplete drainage, infection, damage to other organs, pain and bleeding   Alternatives discussed:  Alternative treatment Universal protocol:    Procedure explained and questions answered to patient or proxy's satisfaction: yes     Patient identity confirmed:  Verbally with patient Location:    Type:  Abscess   Size:  4 x 3   Location: R axilla. Pre-procedure details:    Skin preparation:  Chlorhexidine Anesthesia:    Anesthesia method:  Local infiltration   Local anesthetic:  Lidocaine 2% WITH epi Procedure type:    Complexity:  Simple Procedure details:    Incision types:  Single straight   Incision depth:  Dermal   Wound management:  Probed and deloculated and irrigated with saline  Drainage:  Purulent   Drainage amount:  Copious   Wound treatment:  Wound left open   Packing materials:  1/4 in iodoform gauze Post-procedure details:    Procedure completion:  Tolerated well, no immediate complications    MEDICATIONS ORDERED IN ED: Medications  lidocaine-EPINEPHrine (XYLOCAINE W/EPI) 2 %-1:100000 (with pres) injection 20 mL (20 mLs Intradermal Given 11/02/21 1016)     IMPRESSION / MDM / ASSESSMENT AND PLAN / ED COURSE  I reviewed the triage vital signs and the nursing notes.                               The patient is on the cardiac monitor to evaluate for evidence of arrhythmia and/or significant heart rate changes.   Ddx:  Differential includes the following, with pertinent life- or limb-threatening emergencies considered:  Abscess, cellulitis, hidradenitis, folliculitis, keloid  Patient's presentation is most consistent with acute presentation with potential threat to life  or bodily function.  MDM:  26 year old male here with significant area of induration and fluctuance to the right axilla.  Exam is highly consistent with focal abscess as well as focal cellulitis.  He is not diabetic or immunosuppressed.  No systemic signs of sepsis at this time.  After informed consent, abscess incised and drained as above.  Tolerated well.  Patient had significant improvement in pain.  Will place on empiric antibiotics given the surrounding skin induration, discharged with good return precautions.  Patient is in agreement with this plan.  Motrin as needed pain.   MEDICATIONS GIVEN IN ED: Medications  lidocaine-EPINEPHrine (XYLOCAINE W/EPI) 2 %-1:100000 (with pres) injection 20 mL (20 mLs Intradermal Given 11/02/21 1016)     Consults:     EMR reviewed  Reviewed prior ED visits     FINAL CLINICAL IMPRESSION(S) / ED DIAGNOSES   Final diagnoses:  Axillary abscess  Cellulitis of right axilla     Rx / DC Orders   ED Discharge Orders          Ordered    doxycycline (VIBRAMYCIN) 100 MG capsule  2 times daily        11/02/21 1103    ibuprofen (ADVIL) 600 MG tablet  Every 8 hours PRN        11/02/21 1103             Note:  This document was prepared using Dragon voice recognition software and may include unintentional dictation errors.   Shaune Pollack, MD 11/02/21 1110

## 2021-11-02 NOTE — Discharge Instructions (Signed)
Take the doxycycline twice a day for 7 days.  I prescribed a 14-day course, which you should continue if the area of redness or pain does not resolve after 7 days.  Remove the packing in 48 hours, or return to your PCP or ER for follow-up.

## 2022-06-10 IMAGING — US US ABDOMEN LIMITED
1 series · 15 of 25 positions shown · non-contrast
Comparison: Abdominal sonogram from 9119

CLINICAL DATA: A 26-year-old male presents for evaluation of RIGHT
upper quadrant pain for 1 day.

EXAM:
ULTRASOUND ABDOMEN LIMITED RIGHT UPPER QUADRANT

[Series 1: us abdomen limited ruq · 15 of 38 slices shown]
[im 1/38]
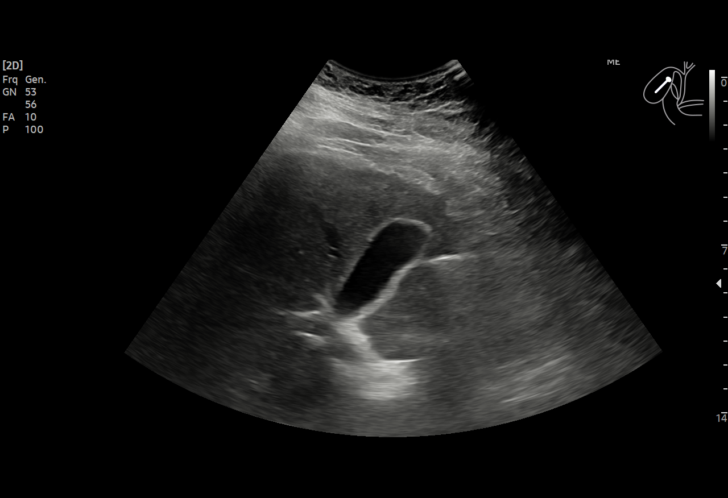
[im 4/38]
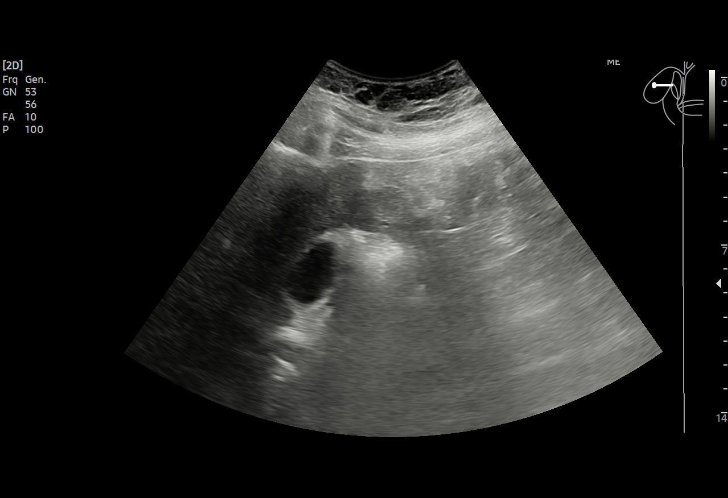
[im 7/38]
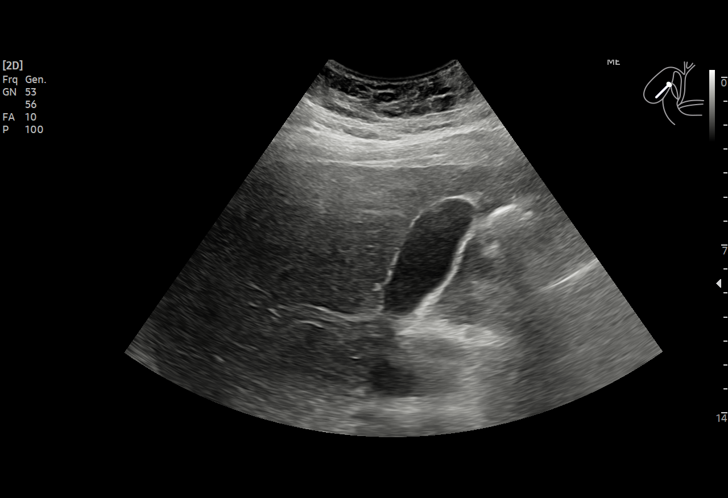
[im 8/38]
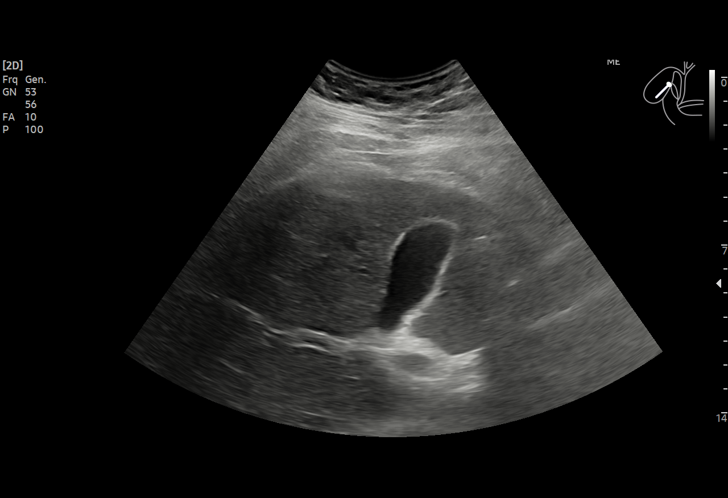
[im 11/38]
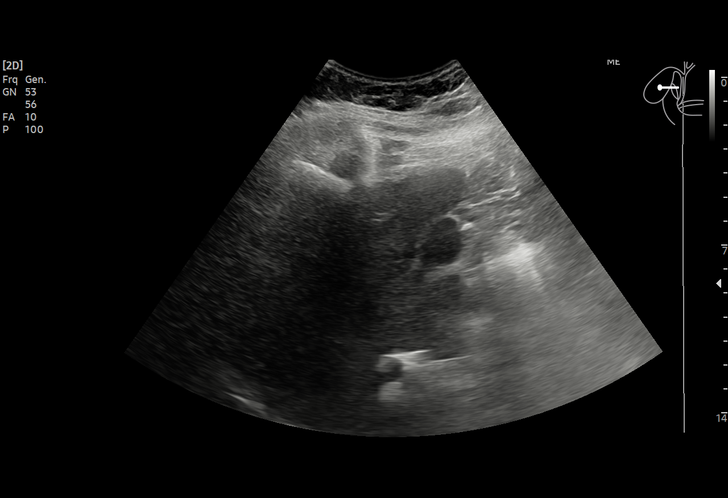
[im 14/38]
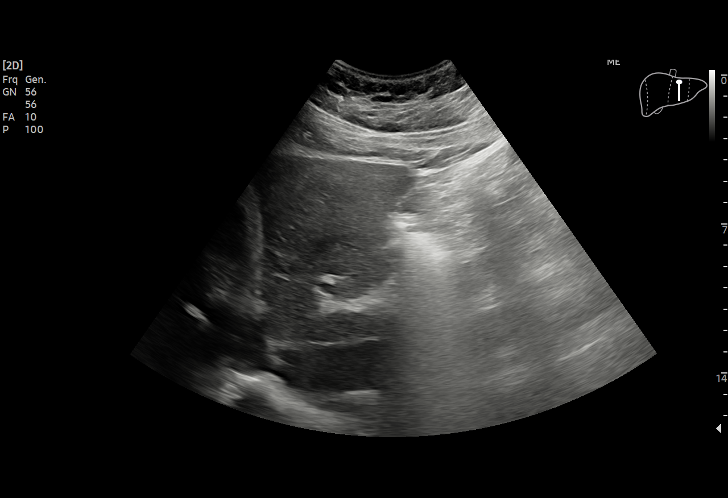
[im 16/38]
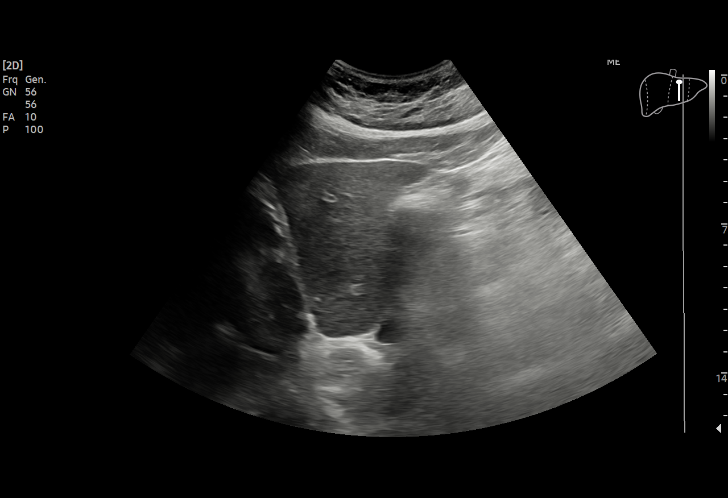
[im 19/38]
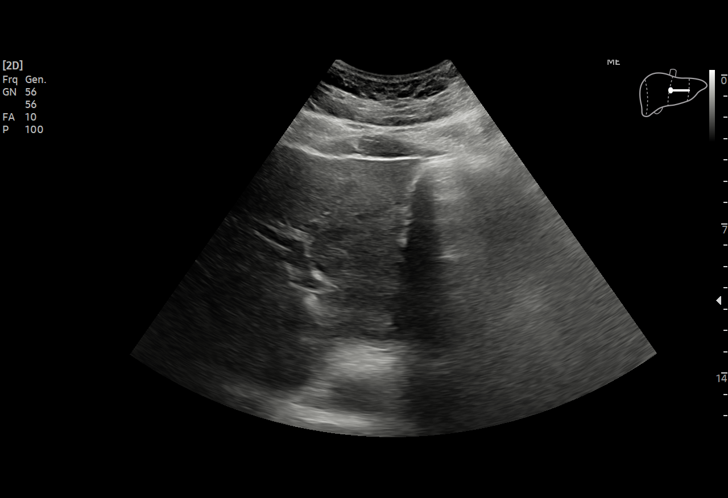
[im 22/38]
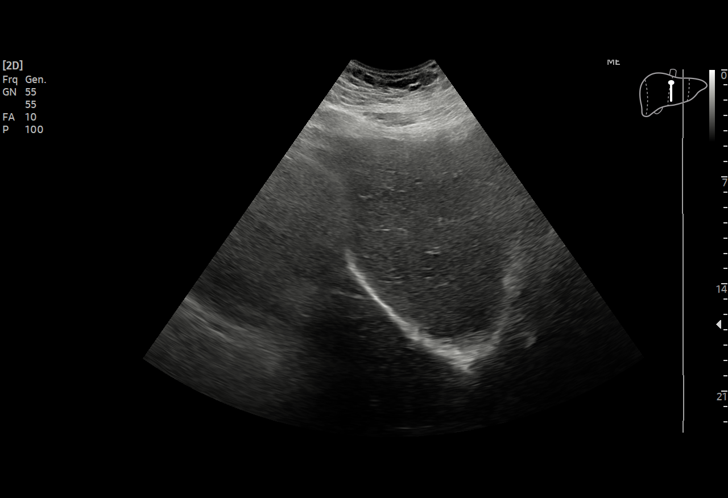
[im 24/38]
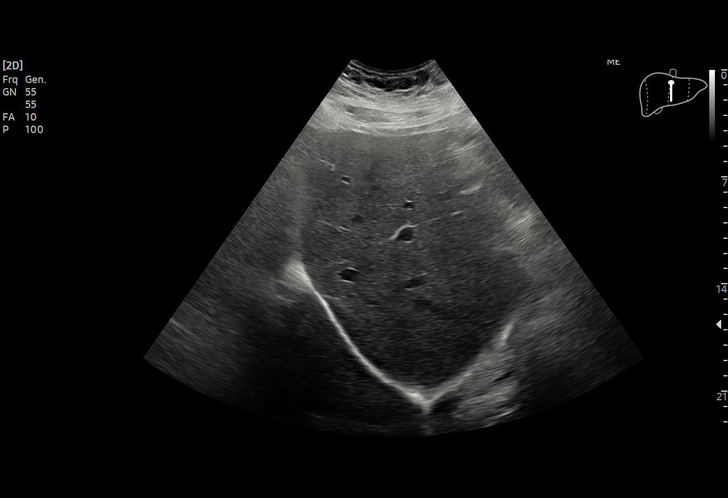
[im 27/38]
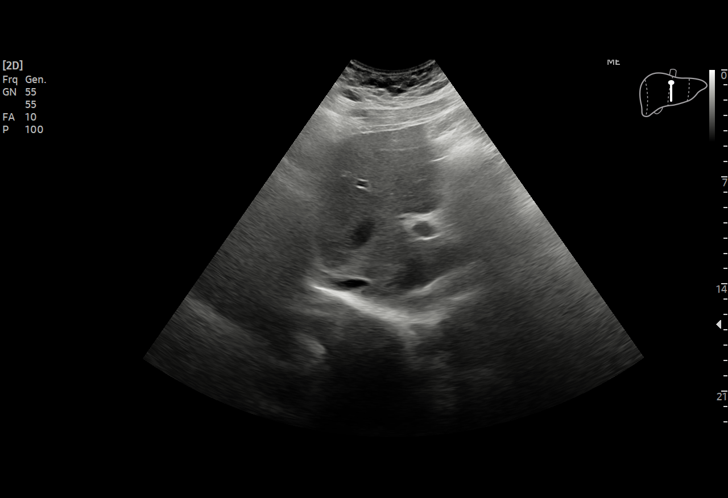
[im 30/38]
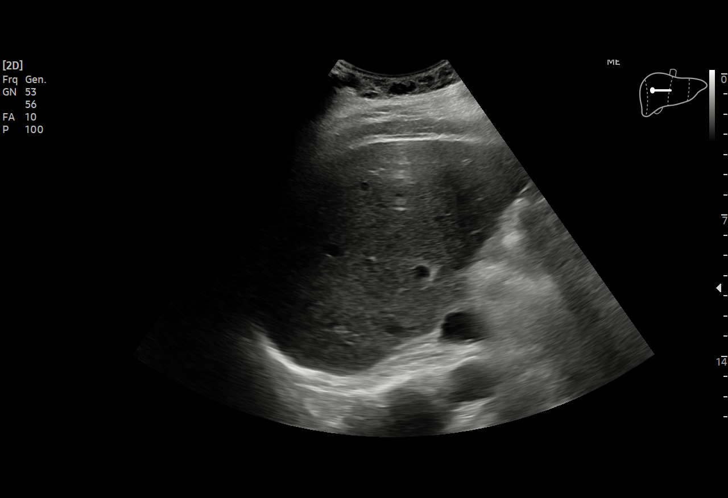
[im 31/38]
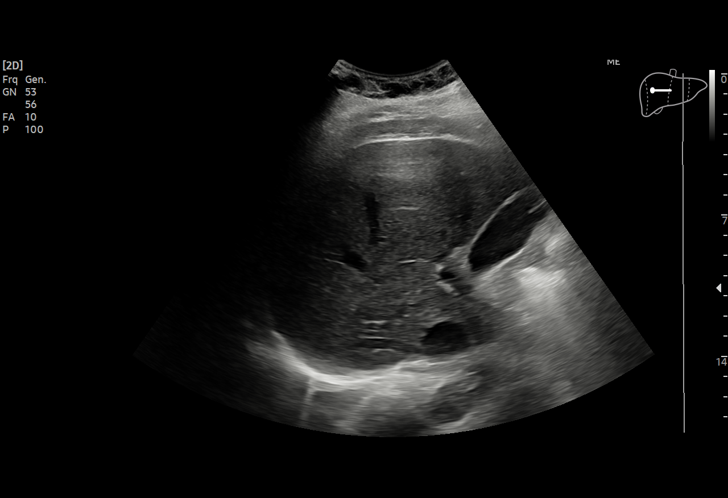
[im 34/38]
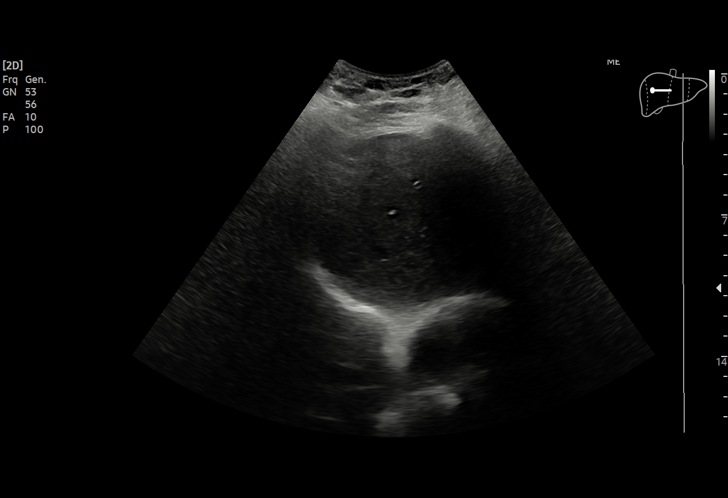
[im 38/38]
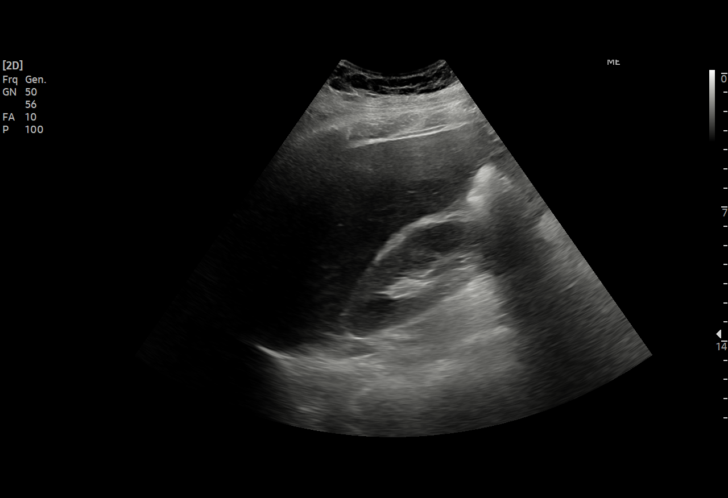

[15 of 25 positions shown; findings below may reference images not displayed]

FINDINGS: Gallbladder:

No gallstones or wall thickening visualized. No sonographic Murphy
sign noted by sonographer.

Common bile duct:

Diameter: 2.2 mm

Liver:

No focal lesion identified. Study limited by acoustic window and
patient body habitus. Parenchymal echogenicity does not appear
increased. Portal vein is patent on color Doppler imaging with
normal direction of blood flow towards the liver.

Other: None.
IMPRESSION: No acute findings on RIGHT upper quadrant ultrasound.

## 2022-10-07 ENCOUNTER — Other Ambulatory Visit: Payer: Self-pay

## 2024-02-21 ENCOUNTER — Encounter: Payer: Self-pay | Admitting: Family Medicine

## 2024-02-23 ENCOUNTER — Emergency Department: Payer: Self-pay

## 2024-02-23 ENCOUNTER — Inpatient Hospital Stay
Admission: EM | Admit: 2024-02-23 | Discharge: 2024-02-26 | DRG: 871 | Disposition: A | Discharge status: Home / Self Care | Payer: Self-pay | Attending: Internal Medicine | Admitting: Internal Medicine

## 2024-02-23 ENCOUNTER — Other Ambulatory Visit: Payer: Self-pay

## 2024-02-23 DIAGNOSIS — F17200 Nicotine dependence, unspecified, uncomplicated: Secondary | ICD-10-CM | POA: Diagnosis present

## 2024-02-23 DIAGNOSIS — B9789 Other viral agents as the cause of diseases classified elsewhere: Secondary | ICD-10-CM | POA: Diagnosis present

## 2024-02-23 DIAGNOSIS — J4531 Mild persistent asthma with (acute) exacerbation: Secondary | ICD-10-CM | POA: Diagnosis present

## 2024-02-23 DIAGNOSIS — J45901 Unspecified asthma with (acute) exacerbation: Principal | ICD-10-CM | POA: Diagnosis present

## 2024-02-23 DIAGNOSIS — I10 Essential (primary) hypertension: Secondary | ICD-10-CM | POA: Diagnosis present

## 2024-02-23 DIAGNOSIS — F17290 Nicotine dependence, other tobacco product, uncomplicated: Secondary | ICD-10-CM | POA: Diagnosis present

## 2024-02-23 DIAGNOSIS — Z79899 Other long term (current) drug therapy: Secondary | ICD-10-CM

## 2024-02-23 DIAGNOSIS — A419 Sepsis, unspecified organism: Principal | ICD-10-CM | POA: Diagnosis present

## 2024-02-23 DIAGNOSIS — J9601 Acute respiratory failure with hypoxia: Secondary | ICD-10-CM | POA: Diagnosis present

## 2024-02-23 DIAGNOSIS — J189 Pneumonia, unspecified organism: Secondary | ICD-10-CM | POA: Diagnosis present

## 2024-02-23 LAB — COMPREHENSIVE METABOLIC PANEL WITH GFR
ALT: 33 U/L (ref 0–44)
AST: 23 U/L (ref 15–41)
Albumin: 4.4 g/dL (ref 3.5–5.0)
Alkaline Phosphatase: 57 U/L (ref 38–126)
Anion gap: 11 (ref 5–15)
BUN: 15 mg/dL (ref 6–20)
CO2: 28 mmol/L (ref 22–32)
Calcium: 9.1 mg/dL (ref 8.9–10.3)
Chloride: 100 mmol/L (ref 98–111)
Creatinine, Ser: 0.93 mg/dL (ref 0.61–1.24)
GFR, Estimated: >60 mL/min (ref >60)
Glucose, Bld: 118 mg/dL — ABNORMAL HIGH (ref 70–99)
Potassium: 3.6 mmol/L (ref 3.5–5.1)
Sodium: 139 mmol/L (ref 135–145)
Total Bilirubin: 1 mg/dL (ref 0.0–1.2)
Total Protein: 8.2 g/dL — ABNORMAL HIGH (ref 6.5–8.1)

## 2024-02-23 LAB — BLOOD GAS, VENOUS
Acid-Base Excess: 1.5 mmol/L (ref 0.0–2.0)
Bicarbonate: 26.6 mmol/L (ref 20.0–28.0)
O2 Saturation: 83.2 %
Patient temperature: 37
pCO2, Ven: 43 mmHg — ABNORMAL LOW (ref 44–60)
pH, Ven: 7.4 (ref 7.25–7.43)
pO2, Ven: 49 mmHg — ABNORMAL HIGH (ref 32–45)

## 2024-02-23 LAB — RESPIRATORY PANEL BY PCR

## 2024-02-23 LAB — RESP PANEL BY RT-PCR (RSV, FLU A&B, COVID)  RVPGX2
Influenza A by PCR: NEGATIVE
Influenza B by PCR: NEGATIVE
Resp Syncytial Virus by PCR: NEGATIVE
SARS Coronavirus 2 by RT PCR: NEGATIVE

## 2024-02-23 LAB — CBC
HCT: 52.2 % — ABNORMAL HIGH (ref 39.0–52.0)
Hemoglobin: 17.7 g/dL — ABNORMAL HIGH (ref 13.0–17.0)
MCH: 30.1 pg (ref 26.0–34.0)
MCHC: 33.9 g/dL (ref 30.0–36.0)
MCV: 88.6 fL (ref 80.0–100.0)
Platelets: 268 K/uL (ref 150–400)
RBC: 5.89 MIL/uL — ABNORMAL HIGH (ref 4.22–5.81)
RDW: 12.3 % (ref 11.5–15.5)
WBC: 10.1 K/uL (ref 4.0–10.5)
nRBC: 0 % (ref 0.0–0.2)

## 2024-02-23 LAB — HIV ANTIBODY (ROUTINE TESTING W REFLEX): HIV Screen 4th Generation wRfx: NONREACTIVE

## 2024-02-23 LAB — MRSA NEXT GEN BY PCR, NASAL: MRSA by PCR Next Gen: NOT DETECTED

## 2024-02-23 LAB — MAGNESIUM: Magnesium: 2.2 mg/dL (ref 1.7–2.4)

## 2024-02-23 LAB — PROCALCITONIN: Procalcitonin: <0.1 ng/mL

## 2024-02-23 LAB — GLUCOSE, CAPILLARY: Glucose-Capillary: 129 mg/dL — ABNORMAL HIGH (ref 70–99)

## 2024-02-23 MED ORDER — IOHEXOL 300 MG/ML  SOLN
75.0000 mL | Freq: Once | INTRAMUSCULAR | Status: AC | PRN
  Administered 2024-02-23: 75 mL via INTRAVENOUS

## 2024-02-23 MED ORDER — ONDANSETRON HCL 4 MG/2ML IJ SOLN: 4.0000 mg | Freq: Four times a day (QID) | INTRAMUSCULAR | Status: DC | PRN

## 2024-02-23 MED ORDER — SODIUM CHLORIDE 0.9 % IV SOLN: 1.0000 g | Freq: Once | INTRAVENOUS | Status: DC

## 2024-02-23 MED ORDER — METHYLPREDNISOLONE SODIUM SUCC 125 MG IJ SOLR
125.0000 mg | Freq: Once | INTRAMUSCULAR | Status: AC
  Administered 2024-02-23: 125 mg via INTRAVENOUS
  Filled 2024-02-23: qty 2

## 2024-02-23 MED ORDER — IPRATROPIUM-ALBUTEROL 0.5-2.5 (3) MG/3ML IN SOLN
3.0000 mL | RESPIRATORY_TRACT | Status: DC | PRN
  Administered 2024-02-23: 3 mL via RESPIRATORY_TRACT
  Filled 2024-02-23: qty 3

## 2024-02-23 MED ORDER — PANTOPRAZOLE SODIUM 40 MG PO TBEC: 40.0000 mg | DELAYED_RELEASE_TABLET | Freq: Every day | ORAL | Status: DC

## 2024-02-23 MED ORDER — POLYETHYLENE GLYCOL 3350 17 G PO PACK
17.0000 g | PACK | Freq: Every day | ORAL | Status: DC | PRN
  Administered 2024-02-24: 17 g via ORAL
  Filled 2024-02-23: qty 1

## 2024-02-23 MED ORDER — IPRATROPIUM-ALBUTEROL 0.5-2.5 (3) MG/3ML IN SOLN
3.0000 mL | Freq: Once | RESPIRATORY_TRACT | Status: AC
  Administered 2024-02-23: 3 mL via RESPIRATORY_TRACT
  Filled 2024-02-23: qty 3

## 2024-02-23 MED ORDER — PANTOPRAZOLE SODIUM 40 MG IV SOLR
40.0000 mg | Freq: Two times a day (BID) | INTRAVENOUS | Status: DC
Start: 2024-02-23 — End: 2024-02-25
  Administered 2024-02-23 – 2024-02-25 (×5): 40 mg via INTRAVENOUS
  Filled 2024-02-23 (×5): qty 10

## 2024-02-23 MED ORDER — ALBUTEROL SULFATE (2.5 MG/3ML) 0.083% IN NEBU
INHALATION_SOLUTION | RESPIRATORY_TRACT | Status: AC
  Administered 2024-02-23: 10 mg
  Filled 2024-02-23: qty 12

## 2024-02-23 MED ORDER — LABETALOL HCL 5 MG/ML IV SOLN
INTRAVENOUS | Status: AC
  Filled 2024-02-23: qty 4

## 2024-02-23 MED ORDER — MAGNESIUM SULFATE 2 GM/50ML IV SOLN
2.0000 g | Freq: Once | INTRAVENOUS | Status: AC
  Administered 2024-02-23: 2 g via INTRAVENOUS
  Filled 2024-02-23 (×2): qty 50

## 2024-02-23 MED ORDER — AMLODIPINE BESYLATE 5 MG PO TABS
5.0000 mg | ORAL_TABLET | Freq: Every day | ORAL | Status: DC
Start: 2024-02-23 — End: 2024-02-24
  Administered 2024-02-23 – 2024-02-24 (×2): 5 mg via ORAL
  Filled 2024-02-23 (×2): qty 1

## 2024-02-23 MED ORDER — ACETAMINOPHEN 650 MG RE SUPP: 650.0000 mg | Freq: Four times a day (QID) | RECTAL | Status: DC | PRN

## 2024-02-23 MED ORDER — HYDRALAZINE HCL 20 MG/ML IJ SOLN
10.0000 mg | INTRAMUSCULAR | Status: DC | PRN
  Administered 2024-02-24: 20 mg via INTRAVENOUS
  Administered 2024-02-24: 10 mg via INTRAVENOUS
  Filled 2024-02-23 (×3): qty 1

## 2024-02-23 MED ORDER — METHYLPREDNISOLONE SODIUM SUCC 40 MG IJ SOLR: 40.0000 mg | Freq: Two times a day (BID) | INTRAMUSCULAR | Status: DC

## 2024-02-23 MED ORDER — ENOXAPARIN SODIUM 60 MG/0.6ML IJ SOSY
50.0000 mg | PREFILLED_SYRINGE | INTRAMUSCULAR | Status: DC
  Administered 2024-02-23 – 2024-02-25 (×3): 50 mg via SUBCUTANEOUS
  Filled 2024-02-23 (×4): qty 0.6

## 2024-02-23 MED ORDER — LABETALOL HCL 5 MG/ML IV SOLN
10.0000 mg | Freq: Once | INTRAVENOUS | Status: AC
  Administered 2024-02-23: 10 mg via INTRAVENOUS

## 2024-02-23 MED ORDER — ACETAMINOPHEN 325 MG PO TABS: 650.0000 mg | ORAL_TABLET | Freq: Four times a day (QID) | ORAL | Status: DC | PRN

## 2024-02-23 MED ORDER — GUAIFENESIN 100 MG/5ML PO LIQD
10.0000 mL | ORAL | Status: DC | PRN
  Administered 2024-02-23 – 2024-02-24 (×3): 10 mL via ORAL
  Filled 2024-02-23 (×3): qty 10

## 2024-02-23 MED ORDER — SODIUM CHLORIDE 0.9 % IV SOLN
500.0000 mg | INTRAVENOUS | Status: DC
  Administered 2024-02-23 – 2024-02-25 (×3): 500 mg via INTRAVENOUS
  Filled 2024-02-23 (×3): qty 5

## 2024-02-23 MED ORDER — SODIUM CHLORIDE 0.9 % IV SOLN: 500.0000 mg | Freq: Once | INTRAVENOUS | Status: DC

## 2024-02-23 MED ORDER — ALBUTEROL SULFATE (2.5 MG/3ML) 0.083% IN NEBU
2.5000 mg | INHALATION_SOLUTION | RESPIRATORY_TRACT | Status: DC
  Administered 2024-02-24 – 2024-02-25 (×8): 2.5 mg via RESPIRATORY_TRACT
  Filled 2024-02-23 (×9): qty 3

## 2024-02-23 MED ORDER — METHYLPREDNISOLONE SODIUM SUCC 40 MG IJ SOLR: 40.0000 mg | Freq: Every day | INTRAMUSCULAR | Status: DC

## 2024-02-23 MED ORDER — SODIUM CHLORIDE 0.9 % IV SOLN
2.0000 g | INTRAVENOUS | Status: DC
  Administered 2024-02-23 – 2024-02-25 (×3): 2 g via INTRAVENOUS
  Filled 2024-02-23 (×4): qty 20

## 2024-02-23 MED ORDER — HYDRALAZINE HCL 20 MG/ML IJ SOLN
10.0000 mg | Freq: Four times a day (QID) | INTRAMUSCULAR | Status: DC | PRN
  Administered 2024-02-23: 10 mg via INTRAVENOUS
  Filled 2024-02-23: qty 1

## 2024-02-23 MED ORDER — ALBUTEROL SULFATE (2.5 MG/3ML) 0.083% IN NEBU
2.5000 mg | INHALATION_SOLUTION | RESPIRATORY_TRACT | Status: AC
  Administered 2024-02-23 – 2024-02-24 (×3): 2.5 mg via RESPIRATORY_TRACT
  Filled 2024-02-23 (×3): qty 3

## 2024-02-23 MED ORDER — ONDANSETRON HCL 4 MG PO TABS: 4.0000 mg | ORAL_TABLET | Freq: Four times a day (QID) | ORAL | Status: DC | PRN

## 2024-02-23 MED ORDER — CHLORHEXIDINE GLUCONATE CLOTH 2 % EX PADS
6.0000 | MEDICATED_PAD | Freq: Every day | CUTANEOUS | Status: DC
  Administered 2024-02-23 – 2024-02-25 (×3): 6 via TOPICAL

## 2024-02-23 MED ORDER — LORAZEPAM 2 MG/ML IJ SOLN
1.0000 mg | Freq: Once | INTRAMUSCULAR | Status: AC
  Administered 2024-02-23: 1 mg via INTRAVENOUS
  Filled 2024-02-23: qty 1

## 2024-02-23 MED ORDER — CARVEDILOL 12.5 MG PO TABS
12.5000 mg | ORAL_TABLET | Freq: Two times a day (BID) | ORAL | Status: DC
  Administered 2024-02-23 – 2024-02-26 (×6): 12.5 mg via ORAL
  Filled 2024-02-23 (×6): qty 1

## 2024-02-23 MED ORDER — METHYLPREDNISOLONE SODIUM SUCC 40 MG IJ SOLR
40.0000 mg | Freq: Every day | INTRAMUSCULAR | Status: DC
  Administered 2024-02-23 – 2024-02-25 (×3): 40 mg via INTRAVENOUS
  Filled 2024-02-23 (×3): qty 1

## 2024-02-23 NOTE — Consult Note (Signed)
 H&P  Case of 28 year old male patient with a past medical history of asthma on albuterol  as needed who is presenting to [facility] with shortness of breath wheezing and chest tightness.  He was found to  be in an asthma exacerbation secondary to community-acquired pneumonia.  He reports that his symptoms started a week ago his cough sputum production shortness of breath progressive dyspnea.  He presented to [facility] initially on nasal cannula on the floor.  CT chest with diffuse airway thickening as well as right upper lobe consolidation.  He was started on ceftriaxone  and azithromycin . Unfortunately course complicated by worsening respiratory status requiring bipap support.   On my encounter patient was in moderate respiratory distress on bipap with a respiratory rate of 30 bpm. Severe diffuse expiratory wheezing. We started an albuterol  neb for 1hr. 2mg  Mag IV ordered.   On re-assessment in 1 hour appears to have improved with improved air movement. Appears more comfortbalt on bipap.   Regarding his asthma he was diagnosed as a child and appears to have a strong family history of asthma.  He has been on albuterol  for the last few years and never on maintenance therapy.  He never required hospitalization for asthma exacerbation.  This is his first one.  Unfortunately he was a smoker but currently vapes daily.  Objective   GEN mild to moderate respiratory distress.  On BiPAP.  Alert and oriented x 4. HEENT supple neck, reactive pupils, EOMI CVS normal S1, normal S2, tachycardic, sinus rhythm Lungs diffuse bilateral expiratory wheezing Abdomen soft nontender nondistended positive bowel sounds Extremities warm well-perfused no edema  Labs and imaging were reviewed  Assessment and plan Case of 28 year old male patient with a past medical history of asthma on albuterol  as needed who is presenting to [facility] with shortness of breath wheezing and chest tightness.  He was found to  be in an asthma exacerbation  secondary to community-acquired pneumonia.  # Acute asthma exacerbation secondary to # Community-acquired pneumonia with CT chest showing right upper lobe consolidation. # Hypertension  Plan []  Albuterol  every 2 hours for the next 6 hours.  Then every 4 hours scheduled. []  IV methylprednisolone  40 mg daily. []  2 g magnesium  sulfate IV []  Agree with ceftriaxone  and azithromycin . []  Can de-escalate from BiPAP. []  Chest PT with MetaNeb every 8 hour.  []  Will need to establish outpatient pulmonary care (I will arrange that).  Will need to be discharged on high-dose ICS/LABA and examples include Advair discus 500-50 1 puff twice a day.  Pulmonary team will follow.  I spent 80 minutes caring for this patient today, including preparing to see the patient, obtaining a medical history , reviewing a separately obtained history, performing a medically appropriate examination and/or evaluation, counseling and educating the patient/family/caregiver, ordering medications, tests, or procedures, documenting clinical information in the electronic health record, and independently interpreting results (not separately reported/billed) and communicating results to the patient/family/caregiver  Darrin Barn, MD [facility] 02/23/2024 7:39 PM

## 2024-02-23 NOTE — ED Triage Notes (Signed)
 Hx asthma with URI and cough and SOB since 5 days. Pt is audibly wheezing and endorses chest tightness. Hypoxic at 83% on RA and hypertensive. First nurse bringing to room now.

## 2024-02-23 NOTE — Progress Notes (Signed)
 PHARMACIST - PHYSICIAN COMMUNICATION  CONCERNING:  Enoxaparin  (Lovenox ) for DVT Prophylaxis    RECOMMENDATION: Patient was prescribed enoxaprin 40mg  q24 hours for VTE prophylaxis.   Filed Weights   02/23/24 0850  Weight: 103.9 kg (229 lb)    Body mass index is 30.21 kg/m.  Estimated Creatinine Clearance: 148.4 mL/min (by C-G formula based on SCr of 0.93 mg/dL).   Based on [facility] policy patient is candidate for enoxaparin  0.5mg /kg TBW SQ every 24 hours based on BMI being >30.  DESCRIPTION: Pharmacy has adjusted enoxaparin  dose per [facility] policy.  Patient is now receiving enoxaparin  0.5 mg/kg every 24 hours   Rankin CANDIE Dills, PharmD, [facility] 02/23/2024 11:58 AM

## 2024-02-23 NOTE — ED Provider Notes (Signed)
 [facility] Provider Note    Event Date/Time   First MD Initiated Contact with Patient 02/23/24 [id number]     (approximate)   History   Shortness of Breath   HPI  Justin [name] is a 28 y.o. male with a history of asthma and hypertension who presents with shortness of breath.  Patient reports it has been worsening over the last several days.  He had a hard time breathing this morning.  Triage pulse ox 83% brought back immediately     Physical Exam   Triage Vital Signs: ED Triage Vitals  Encounter Vitals Group     BP 02/23/24 0851 (!) 173/130     Girls Systolic BP Percentile --      Girls Diastolic BP Percentile --      Boys Systolic BP Percentile --      Boys Diastolic BP Percentile --      Pulse Rate 02/23/24 0851 (!) 107     Resp 02/23/24 0851 (!) 24     Temp 02/23/24 0851 98 F (36.7 C)     Temp Source 02/23/24 0851 Oral     SpO2 02/23/24 0851 (!) 83 %     Weight 02/23/24 0850 103.9 kg (229 lb)     Height 02/23/24 0850 1.854 m (6' 1)     Head Circumference --      Peak Flow --      Pain Score 02/23/24 0849 0     Pain Loc --      Pain Education --      Exclude from Growth Chart --     Most recent vital signs: Vitals:   02/23/24 1236 02/23/24 1325  BP:  (!) 187/115  Pulse:    Resp:    Temp: 99 F (37.2 C)   SpO2:       General: Awake,  CV:  Good peripheral perfusion.  Resp:  Increased effort with tachypnea, diffuse wheezing Abd:  No distention.  Other:  No calf pain or swelling   ED Results / Procedures / Treatments   Labs (all labs ordered are listed, but only abnormal results are displayed) Labs Reviewed  CBC - Abnormal; Notable for the following components:      Result Value   RBC 5.89 (*)    Hemoglobin 17.7 (*)    HCT 52.2 (*)    All other components within normal limits  COMPREHENSIVE METABOLIC PANEL WITH GFR - Abnormal; Notable for the following components:   Glucose, Bld 118 (*)    Total Protein 8.2 (*)     All other components within normal limits  RESP PANEL BY RT-PCR (RSV, FLU A&B, COVID)  RVPGX2  CULTURE, BLOOD (ROUTINE X 2)  CULTURE, BLOOD (ROUTINE X 2)  HIV ANTIBODY (ROUTINE TESTING W REFLEX)  LEGIONELLA PNEUMOPHILA SEROGP 1 UR AG  STREP PNEUMONIAE URINARY ANTIGEN  CBC  CREATININE, SERUM     EKG     RADIOLOGY Chest x-ray     PROCEDURES:  Critical Care performed: yes  CRITICAL CARE Performed by: Lamar Price   Total critical care time: 30 minutes  Critical care time was exclusive of separately billable procedures and treating other patients.  Critical care was necessary to treat or prevent imminent or life-threatening deterioration.  Critical care was time spent personally by me on the following activities: development of treatment plan with patient and/or surrogate as well as nursing, discussions with consultants, evaluation of patient's response to treatment, examination of patient, obtaining  history from patient or surrogate, ordering and performing treatments and interventions, ordering and review of laboratory studies, ordering and review of radiographic studies, pulse oximetry and re-evaluation of patient's condition.   Procedures   MEDICATIONS ORDERED IN ED: Medications  cefTRIAXone  (ROCEPHIN ) 2 g in sodium chloride  0.9 % 100 mL IVPB (0 g Intravenous Stopped 02/23/24 1234)  azithromycin  (ZITHROMAX ) 500 mg in sodium chloride  0.9 % 250 mL IVPB (500 mg Intravenous New Bag/Given 02/23/24 1245)  enoxaparin  (LOVENOX ) injection 50 mg (has no administration in time range)  acetaminophen  (TYLENOL ) tablet 650 mg (has no administration in time range)    Or  acetaminophen  (TYLENOL ) suppository 650 mg (has no administration in time range)  polyethylene glycol (MIRALAX  / GLYCOLAX ) packet 17 g (has no administration in time range)  ondansetron  (ZOFRAN ) tablet 4 mg (has no administration in time range)    Or  ondansetron  (ZOFRAN ) injection 4 mg (has no administration in  time range)  amLODipine  (NORVASC ) tablet 5 mg (5 mg Oral Given 02/23/24 1325)  carvedilol  (COREG ) tablet 12.5 mg (has no administration in time range)  methylPREDNISolone  sodium succinate (SOLU-MEDROL ) 40 mg/mL injection 40 mg (has no administration in time range)  ipratropium-albuterol  (DUONEB) 0.5-2.5 (3) MG/3ML nebulizer solution 3 mL (has no administration in time range)  pantoprazole  (PROTONIX ) injection 40 mg (40 mg Intravenous Given 02/23/24 1240)  ipratropium-albuterol  (DUONEB) 0.5-2.5 (3) MG/3ML nebulizer solution 3 mL (3 mLs Nebulization Given 02/23/24 0907)  ipratropium-albuterol  (DUONEB) 0.5-2.5 (3) MG/3ML nebulizer solution 3 mL (3 mLs Nebulization Given 02/23/24 0907)  ipratropium-albuterol  (DUONEB) 0.5-2.5 (3) MG/3ML nebulizer solution 3 mL (3 mLs Nebulization Given 02/23/24 0906)  methylPREDNISolone  sodium succinate (SOLU-MEDROL ) 125 mg/2 mL injection 125 mg (125 mg Intravenous Given 02/23/24 0903)  iohexol  (OMNIPAQUE ) 300 MG/ML solution 75 mL (75 mLs Intravenous Contrast Given 02/23/24 1028)     IMPRESSION / MDM / ASSESSMENT AND PLAN / ED COURSE  I reviewed the triage vital signs and the nursing notes. Patient's presentation is most consistent with acute presentation with potential threat to life or bodily function.  Patient presents with acute respiratory failure with wheezing likely secondary to asthma exacerbation that is severe.  He is 83% on room air.  Oxygen, DuoNebs, IV Solu-Medrol  ordered,  Patient improved after treatment however chest x-ray shows likely atelectasis of the right upper lobe, will send for CT based on radiology recommendation  CT scan demonstrates likely CAP with mucous plugging of the right upper lobe, IV Rocephin , IV azithromycin  ordered, have consulted the hospitalist for admission      FINAL CLINICAL IMPRESSION(S) / ED DIAGNOSES   Final diagnoses:  Severe asthma with exacerbation, unspecified whether persistent  Acute respiratory failure with  hypoxia (HCC)  Community acquired pneumonia of right upper lobe of lung     Rx / DC Orders   ED Discharge Orders     None        Note:  This document was prepared using Dragon voice recognition software and may include unintentional dictation errors.   Arlander Charleston, MD 02/23/24 1331

## 2024-02-23 NOTE — Plan of Care (Signed)
  Problem: Education: Goal: Knowledge of General Education information will improve Description: Including pain rating scale, medication(s)/side effects and non-pharmacologic comfort measures Outcome: Not Progressing   Problem: Health Behavior/Discharge Planning: Goal: Ability to manage health-related needs will improve Outcome: Not Progressing   Problem: Nutrition: Goal: Adequate nutrition will be maintained Outcome: Not Progressing   Problem: Activity: Goal: Risk for activity intolerance will decrease Outcome: Not Progressing   Problem: Coping: Goal: Level of anxiety will decrease Outcome: Not Progressing   Problem: Elimination: Goal: Will not experience complications related to bowel motility Outcome: Not Progressing Goal: Will not experience complications related to urinary retention Outcome: Not Progressing   Problem: Pain Managment: Goal: General experience of comfort will improve and/or be controlled Outcome: Not Progressing   Problem: Safety: Goal: Ability to remain free from injury will improve Outcome: Not Progressing   Problem: Skin Integrity: Goal: Risk for impaired skin integrity will decrease Outcome: Not Progressing   Problem: Activity: Goal: Ability to tolerate increased activity will improve Outcome: Not Progressing   Problem: Clinical Measurements: Goal: Ability to maintain a body temperature in the normal range will improve Outcome: Not Progressing   Problem: Respiratory: Goal: Ability to maintain adequate ventilation will improve Outcome: Not Progressing Goal: Ability to maintain a clear airway will improve Outcome: Not Progressing  Patient transferred to ICU to be placed on BIPAP after ambulating in room and SP02 dropping to 70s and not recovering. Patient alert x4 able to make all needs known

## 2024-02-23 NOTE — H&P (Addendum)
 History and Physical    Justin [name] FMW:[id number] DOB: 08/25/1995 DOA: 02/23/2024  DOS: the patient was seen and examined on 02/23/2024  [facility]P: [facility]   Patient coming from: Home  I have personally briefly reviewed patient's old medical records in [facility]  Chief Complaint: Cough and shortness of breath for 5 days  HPI: Justin [name] is a pleasant 28 y.o. male with medical history significant for asthma, HTN, priapism and chronic vape use who came into the ED complaining of cough cold congestion and shortness of breath for 5 days.  Patient endorsed that he had urgent audible wheezing and chest tightness.  He was hypoxemic around 83% on room air and blood pressure was high. Patient denies any fever or chills, nausea vomiting, abdominal pain, diarrhea, palpitations.  ED Course: Upon arrival to the ED, patient is found to be hypertensive around 173/130, tachycardic around 107, tachypneic around 14, saturation 83% on room air.  Chest x-ray showed possible right upper lobe mucous plugging versus pneumonia.  CT chest showed possible pneumonia around that area as well.  Patient was given ceftriaxone , azithromycin , albuterol  nebulization, DuoNeb nebulization, methylprednisolone  125 mg and hospitalist service was consulted for evaluation for admission.  Review of Systems:  ROS  All other systems negative except as noted in the HPI.  Past Medical History:  Diagnosis Date   Asthma    Hypertension    Priapism     History reviewed. No pertinent surgical history.   reports that he has quit smoking. His smoking use included cigarettes. He has never used smokeless tobacco. He reports current alcohol use. He reports current drug use. Drug: Marijuana.  Allergies  Allergen Reactions   Other Hives    Sulfate    History reviewed. No pertinent family history.  Prior to Admission medications   Medication Sig Start Date End Date Taking? Authorizing Provider   carvedilol  (COREG ) 12.5 MG tablet Take 12.5 mg by mouth 2 (two) times daily. 02/11/24  Yes [provider]  hydrALAZINE  (APRESOLINE ) 50 MG tablet Take 50 mg by mouth 2 (two) times daily. 02/11/24  Yes [provider]  albuterol  (VENTOLIN  HFA) 108 (90 Base) MCG/ACT inhaler Inhale 2 puffs into the lungs every 4 (four) hours as needed for wheezing. 07/15/19   [provider]  amLODipine  (NORVASC ) 5 MG tablet Take 1 tablet (5 mg total) by mouth daily. 08/19/21 08/19/22  Fisher, Devere ORN, PA-C  fluticasone  (FLONASE ) 50 MCG/ACT nasal spray Place 1 spray into both nostrils daily. 04/21/20 04/21/21  Willo Dunnings, MD  ibuprofen  (ADVIL ) 600 MG tablet Take 1 tablet (600 mg total) by mouth every 8 (eight) hours as needed for moderate pain. 11/02/21   Angelena Smalls, MD  pantoprazole  (PROTONIX ) 40 MG tablet Take 1 tablet (40 mg total) by mouth daily. 08/19/21 08/19/22  Gasper Devere ORN, PA-C    Physical Exam: Vitals:   02/23/24 0851 02/23/24 0911 02/23/24 0930 02/23/24 1000  BP: (!) 173/130  (!) 175/108 (!) 161/101  Pulse: (!) 107  95 100  Resp: (!) 24  13   Temp: 98 F (36.7 C)     TempSrc: Oral     SpO2: (!) 83% 94% 94% 91%  Weight:      Height:        Physical Exam   Constitutional: Alert, awake, calm, comfortable HEENT: Neck supple Respiratory: Bilateral decreased air entry at the bases.  Scattered wheezing on bilateral lung field, no rhonchi no rales Cardiovascular: Regular rate  and rhythm, no murmurs / rubs / gallops. No extremity edema. 2+ pedal pulses. No carotid bruits.  Abdomen: Soft, no tenderness, Bowel sounds positive.  Musculoskeletal: no clubbing / cyanosis. Good ROM, no contractures. Normal muscle tone.  Skin: no rashes, lesions, ulcers. Neurologic: CN 2-12 grossly intact. Sensation intact, No focal deficit identified Psychiatric: Alert and oriented x 3. Normal mood.    Labs on Admission: I have personally reviewed following labs and imaging  studies  CBC: Recent Labs  Lab 02/23/24 0859  WBC 10.1  HGB 17.7*  HCT 52.2*  MCV 88.6  PLT 268   Basic Metabolic Panel: Recent Labs  Lab 02/23/24 0859  NA 139  K 3.6  CL 100  CO2 28  GLUCOSE 118*  BUN 15  CREATININE 0.93  CALCIUM 9.1   GFR: Estimated Creatinine Clearance: 148.4 mL/min (by C-G formula based on SCr of 0.93 mg/dL). Liver Function Tests: Recent Labs  Lab 02/23/24 0859  AST 23  ALT 33  ALKPHOS 57  BILITOT 1.0  PROT 8.2*  ALBUMIN 4.4   No results for input(s): LIPASE, AMYLASE in the last 168 hours. No results for input(s): AMMONIA in the last 168 hours. Coagulation Profile: No results for input(s): INR, PROTIME in the last 168 hours. Cardiac Enzymes: No results for input(s): CKTOTAL, CKMB, CKMBINDEX, TROPONINI, TROPONINIHS in the last 168 hours. BNP (last 3 results) No results for input(s): BNP in the last 8760 hours. HbA1C: No results for input(s): HGBA1C in the last 72 hours. CBG: No results for input(s): GLUCAP in the last 168 hours. Lipid Profile: No results for input(s): CHOL, HDL, LDLCALC, TRIG, CHOLHDL, LDLDIRECT in the last 72 hours. Thyroid Function Tests: No results for input(s): TSH, T4TOTAL, FREET4, T3FREE, THYROIDAB in the last 72 hours. Anemia Panel: No results for input(s): VITAMINB12, FOLATE, FERRITIN, TIBC, IRON, RETICCTPCT in the last 72 hours. Urine analysis:    Component Value Date/Time   COLORURINE YELLOW (A) 08/19/2021 1534   APPEARANCEUR HAZY (A) 08/19/2021 1534   APPEARANCEUR Clear 04/02/2014 0849   LABSPEC 1.024 08/19/2021 1534   LABSPEC 1.018 04/02/2014 0849   PHURINE 7.0 08/19/2021 1534   GLUCOSEU NEGATIVE 08/19/2021 1534   GLUCOSEU Negative 04/02/2014 0849   HGBUR NEGATIVE 08/19/2021 1534   BILIRUBINUR NEGATIVE 08/19/2021 1534   BILIRUBINUR Negative 04/02/2014 0849   KETONESUR NEGATIVE 08/19/2021 1534   PROTEINUR 30 (A) 08/19/2021 1534   NITRITE  NEGATIVE 08/19/2021 1534   LEUKOCYTESUR NEGATIVE 08/19/2021 1534   LEUKOCYTESUR Trace 04/02/2014 0849    Radiological Exams on Admission: I have personally reviewed images CT Chest W Contrast Result Date: 02/23/2024 CLINICAL DATA:  Respiratory illness, cough, right upper lobe airspace opacity, hypoxia EXAM: CT CHEST WITH CONTRAST TECHNIQUE: Multidetector CT imaging of the chest was performed during intravenous contrast administration. RADIATION DOSE REDUCTION: This exam was performed according to the departmental dose-optimization program which includes automated exposure control, adjustment of the mA and/or kV according to patient size and/or use of iterative reconstruction technique. CONTRAST:  75mL OMNIPAQUE  IOHEXOL  300 MG/ML  SOLN COMPARISON:  Radiograph 02/23/2024 FINDINGS: Cardiovascular: Unremarkable Mediastinum/Nodes: Circumferential distal esophageal wall thickening, nonspecific although esophagitis would be a common cause. Borderline enlarged 1.0 cm right lower paratracheal node on image 38 series 4. Mildly enlarged right hilar lymph node 1.2 cm in short axis on image 56 series 2. Additional small paratracheal and subcarinal lymph nodes observed. Lungs/Pleura: Complete truncation of the right upper lobe bronchus without a well-defined mass, presumably from complete plugging the right upper lobe proximal  bronchial tree, with associated volume loss and consolidation in the right upper lobe. Due to the surrounding atelectasis and adenopathy, the possibility of a small endobronchial lesion in the right upper lobe bronchus is not completely excluded. However, there is bilateral airway thickening as well as scattered airway plugging in both lower lobes as well as the left upper lobe and right middle lobe and accordingly mucus plugging is favored over neoplasm. Faint patchy ground-glass opacities in the right middle lobe with associated airway thickening and airway plugging. Upper Abdomen: Unremarkable  Musculoskeletal: Unremarkable IMPRESSION: 1. Airway thickening and scattered airway plugging in both lungs. 2. Complete truncation of the right upper lobe bronchus without a well-defined mass, presumably from complete plugging of the right upper lobe proximal bronchial tree, with associated volume loss and consolidation in the right upper lobe. A component of right upper lobe pneumonia is favored. 3. Faint patchy ground-glass opacities in the right middle lobe with associated airway thickening and airway plugging, probably from mild atypical pneumonia. 4. Mildly enlarged right hilar lymph node and borderline enlarged right lower paratracheal lymph node, likely reactive. 5. Circumferential distal esophageal wall thickening, nonspecific although esophagitis would be a common cause. Electronically Signed   By: Ryan Salvage M.D.   On: 02/23/2024 10:52   DG Chest Port 1 View Result Date: 02/23/2024 CLINICAL DATA:  Shortness of breath. Asthma with upper respiratory infection and cough over the last 5 days. Wheezing. Chest tightness. Hypoxia. Hypertension. EXAM: PORTABLE CHEST 1 VIEW COMPARISON:  03/29/2016 FINDINGS: Complete or nearly complete atelectasis of the right upper lobe with resulting volume loss and shift of mediastinal structures to the right. Heart size within normal limits. The lungs appear otherwise clear. No blunting of the costophrenic angles. IMPRESSION: 1. Complete or nearly complete atelectasis of the right upper lobe with resulting volume loss and shift of mediastinal structures to the right. Possibilities may include mucous plugging or a centrally obstructing lesion/mass, although malignancy would be unlikely in this patient's age group. Chest CT with contrast is recommended for further characterization. Electronically Signed   By: Ryan Salvage M.D.   On: 02/23/2024 09:56    EKG: N/A    Assessment/Plan Principal Problem:   CAP (community acquired pneumonia) Active Problems:    Acute exacerbation of asthma with allergic rhinitis   HTN (hypertension)   Tobacco use disorder    Assessment and Plan: 28 year old male with history of asthma, HTN and chronic vaping who came into ED complaining of cough congestion shortness of breath.  1.  Acute exacerbation of mild persistent asthma. - Patient will be placed in observation - Patient has nebulization at home and he takes inhaler every day. - He was given Solu-Medrol  125 mg and antibiotics in the ED. - Will give him Solu-Medrol  40 mg twice daily and ceftriaxone  and azithromycin . - Will check Streptococcus and Legionella antigen. - Continue nebulization.  2.  Community-acquired pneumonia - Treatment as above  3.  HTN - Will continue his medications, amlodipine  and Coreg  - Continue to monitor blood pressure - Will give him hydralazine  as needed for systolic blood pressure more than 160  4.  Chronic tobacco use/vaping - Extensive counseling was done.  Addendum: Patient had a hypoxemic incident.  Required BiPAP.  Patient was transferred to stepdown unit for BiPAP.  DVT prophylaxis: Lovenox  Code Status: Full Code Family Communication: None Disposition Plan: Home Consults called: None Admission status: Observation, Med-Surg   Nena Rebel, MD [facility] 02/23/2024, 12:23 PM

## 2024-02-23 NOTE — Significant Event (Signed)
 Rapid Response Event Note   Reason for Call :  Hypoxia, work of breathing, shortness of breath  Initial Focused Assessment:  Rapid response RN arrived in patient's room with patient sitting up in bed wearing NRB mask at 15L surrounded by [facility] staff. Initial oxygen saturations low 80s on NRB, respirations labored, HR 100s to 110s, appeared ST.   Interventions:  Dr. Roann arrived at bedside shortly after rapid response team arrived and ordered patient to get transferred to SD and start on BiPAP. RT went to get BiPAP set up and patient transferred to ICU on NRB mask. Just before transport patient's oxygen saturations up to 97% on NRB.  Plan of Care:  Patient transferred to ICU 8.  Event Summary:   MD Notified: Dr. Roann Call Time: 17:14 Arrival Time:17:15 End Time: 17:29  Gar Glance, LAURAINE NORRIS, RN

## 2024-02-23 NOTE — ED Notes (Signed)
 Patient transported to CT

## 2024-02-24 DIAGNOSIS — J45901 Unspecified asthma with (acute) exacerbation: Secondary | ICD-10-CM

## 2024-02-24 LAB — CBC WITH DIFFERENTIAL/PLATELET
Abs Immature Granulocytes: 0.03 K/uL (ref 0.00–0.07)
Basophils Absolute: 0 K/uL (ref 0.0–0.1)
Basophils Relative: 0 %
Eosinophils Absolute: 0 K/uL (ref 0.0–0.5)
Eosinophils Relative: 0 %
HCT: 49 % (ref 39.0–52.0)
Hemoglobin: 16.5 g/dL (ref 13.0–17.0)
Immature Granulocytes: 0 %
Lymphocytes Relative: 7 %
Lymphs Abs: 0.9 K/uL (ref 0.7–4.0)
MCH: 29.6 pg (ref 26.0–34.0)
MCHC: 33.7 g/dL (ref 30.0–36.0)
MCV: 87.8 fL (ref 80.0–100.0)
Monocytes Absolute: 0.4 K/uL (ref 0.1–1.0)
Monocytes Relative: 3 %
Neutro Abs: 11 K/uL — ABNORMAL HIGH (ref 1.7–7.7)
Neutrophils Relative %: 90 %
Platelets: 288 K/uL (ref 150–400)
RBC: 5.58 MIL/uL (ref 4.22–5.81)
RDW: 12.4 % (ref 11.5–15.5)
WBC: 12.3 K/uL — ABNORMAL HIGH (ref 4.0–10.5)
nRBC: 0 % (ref 0.0–0.2)

## 2024-02-24 LAB — STREP PNEUMONIAE URINARY ANTIGEN: Strep Pneumo Urinary Antigen: NEGATIVE

## 2024-02-24 LAB — MAGNESIUM: Magnesium: 2.5 mg/dL — ABNORMAL HIGH (ref 1.7–2.4)

## 2024-02-24 LAB — BASIC METABOLIC PANEL WITH GFR
Anion gap: 12 (ref 5–15)
BUN: 18 mg/dL (ref 6–20)
CO2: 25 mmol/L (ref 22–32)
Calcium: 8.6 mg/dL — ABNORMAL LOW (ref 8.9–10.3)
Chloride: 100 mmol/L (ref 98–111)
Creatinine, Ser: 0.97 mg/dL (ref 0.61–1.24)
GFR, Estimated: >60 mL/min (ref >60)
Glucose, Bld: 129 mg/dL — ABNORMAL HIGH (ref 70–99)
Potassium: 4.4 mmol/L (ref 3.5–5.1)
Sodium: 137 mmol/L (ref 135–145)

## 2024-02-24 LAB — LEGIONELLA PNEUMOPHILA SEROGP 1 UR AG: L. pneumophila Serogp 1 Ur Ag: NEGATIVE

## 2024-02-24 LAB — PHOSPHORUS: Phosphorus: 4.9 mg/dL — ABNORMAL HIGH (ref 2.5–4.6)

## 2024-02-24 MED ORDER — ALBUTEROL SULFATE (2.5 MG/3ML) 0.083% IN NEBU
INHALATION_SOLUTION | RESPIRATORY_TRACT | Status: AC
  Filled 2024-02-24: qty 3

## 2024-02-24 MED ORDER — AMLODIPINE BESYLATE 5 MG PO TABS
5.0000 mg | ORAL_TABLET | Freq: Once | ORAL | Status: AC
  Administered 2024-02-24: 5 mg via ORAL
  Filled 2024-02-24: qty 1

## 2024-02-24 MED ORDER — AMLODIPINE BESYLATE 10 MG PO TABS
10.0000 mg | ORAL_TABLET | Freq: Every day | ORAL | Status: DC
  Administered 2024-02-25 – 2024-02-26 (×2): 10 mg via ORAL
  Filled 2024-02-24 (×2): qty 1

## 2024-02-24 MED ORDER — ALBUTEROL SULFATE (2.5 MG/3ML) 0.083% IN NEBU: 2.5000 mg | INHALATION_SOLUTION | RESPIRATORY_TRACT | Status: DC | PRN

## 2024-02-24 NOTE — Progress Notes (Signed)
 . Progress Note   Patient: Justin [name] FMW:[id number] DOB: [birth year] DOA: 02/23/2024     0 DOS: the patient was seen and examined on 02/24/2024   Brief hospital course:  Justin [name] is a pleasant 27 y.o. male with medical history significant for asthma, HTN, priapism and chronic vape use who came into the ED complaining of cough cold congestion and shortness of breath for 5 days.  Patient endorsed that he had urgent audible wheezing and chest tightness.  He was hypoxemic around 83% on room air and blood pressure was high. Patient denies any fever or chills, nausea vomiting, abdominal pain, diarrhea, palpitations.   ED Course: Upon arrival to the ED, patient is found to be hypertensive around 173/130, tachycardic around 107, tachypneic around 14, saturation 83% on room air.  Chest x-ray showed possible right upper lobe mucous plugging versus pneumonia.  CT chest showed possible pneumonia around that area as well.  Patient was given ceftriaxone , azithromycin , albuterol  nebulization, DuoNeb nebulization, methylprednisolone  125 mg and hospitalist service was consulted for evaluation for admission.     Assessment and Plan:  Acute hypoxic respiratory failure Secondary to acute asthma exacerbation most likely triggered by an acute viral illness Patient initially required BiPAP due to increased work of breathing, tachypnea, hypoxia with room air pulse oximetry of 83% Pulse oximetry and work of breathing improved on BiPAP and patient has been weaned down to nasal cannula Will attempt to wean off oxygen as tolerated    Acute asthma exacerbation Community-acquired pneumonia Secondary to acute viral illness Patient admits to having sick contact Respiratory viral panel was positive for rhinovirus Continue systemic steroids Continue as needed and scheduled bronchodilator therapy Imaging shows right upper lobe consolidation Continue empiric antibiotic therapy with Rocephin  and  Zithromax .    Hypertension Poorly controlled Crease amlodipine  from 5 mg to 10 mg daily     09/24 -transferred to stepdown after rapid response was called due to worsening respiratory distress.  Patient was noted to be tachypneic, tachycardic and had worsening hypoxia requiring BiPAP  09/25 -off BiPAP and on nasal cannula.  Less short of breath.  Remains tachycardic on the monitor and continues to have diffuse wheezes      Subjective: Less short of breath when compared to admission but continues to have diffuse wheezes and is tachycardic  Physical Exam: Vitals:   02/24/24 1200 02/24/24 1300 02/24/24 1400 02/24/24 1422  BP: (!) 156/100 (!) 146/103 (!) 154/99   Pulse: (!) 125 95 (!) 110   Resp: (!) 30 (!) 27 (!) 23   Temp: 98.2 F (36.8 C)     TempSrc: Oral     SpO2: 93% 90% 95% 93%  Weight:      Height:       Constitutional: Alert, awake, calm, comfortable HEENT: Neck supple Respiratory: Bilateral decreased air entry at the bases.  Diffuse expiratory wheezes, scattered rhonchi Cardiovascular: S1, S2 tachycardic, no murmurs / rubs / gallops. No extremity edema. 2+ pedal pulses. No carotid bruits.  Abdomen: Soft, no tenderness, Bowel sounds positive.  Musculoskeletal: no clubbing / cyanosis. Good ROM, no contractures. Normal muscle tone.  Skin: no rashes, lesions, ulcers. Neurologic: CN 2-12 grossly intact. Sensation intact, No focal deficit identified Psychiatric: Alert and oriented x 3. Normal mood.      Data Reviewed: White count 12.3, hemoglobin 16.5, hematocrit 49.,  Platelet count 288, phosphorus 4.9, magnesium  2.5, sodium 137, potassium 4.4, chloride 100, bicarb 25, BUN 18, creatinine 0.87 Labs reviewed  Family Communication:  Plan of care discussed with patient at the bedside.  He verbalizes understanding and agrees to the plan.  Disposition: Status is: Inpatient   Planned Discharge Destination: Home    Time spent: 55 minutes  Author: Aimee Somerset,  MD 02/24/2024 2:48 PM  For on call review [email or web address].

## 2024-02-24 NOTE — Progress Notes (Signed)
 Case of 28 year old male patient with a past medical history of asthma on albuterol  as needed who is presenting to [facility] with shortness of breath wheezing and chest tightness.  He was found to  be in an asthma exacerbation secondary to community-acquired pneumonia.   He reports that his symptoms started a week ago his cough sputum production shortness of breath progressive dyspnea.  He presented to [facility] initially on nasal cannula on the floor.  CT chest with diffuse airway thickening as well as right upper lobe consolidation.  He was started on ceftriaxone  and azithromycin . Unfortunately course complicated by worsening respiratory status requiring bipap support.    On my encounter patient was in moderate respiratory distress on bipap with a respiratory rate of 30 bpm. Severe diffuse expiratory wheezing. We started an albuterol  neb for 1hr. 2mg  Mag IV ordered.    On re-assessment in 1 hour appears to have improved with improved air movement. Appears more comfortbalt on bipap.    Regarding his asthma he was diagnosed as a child and appears to have a strong family history of asthma.  He has been on albuterol  for the last few years and never on maintenance therapy.  He never required hospitalization for asthma exacerbation.  This is his first one.  Unfortunately he was a smoker but currently vapes daily.   Subjective:  Patient feeling better to day. Able to speak in full sentences. Remains with extensive wheezing but much improved since yesterda.  Started also on CPT and has been able to cough up phlegm easier.  Currently on nasal cannula.   Objective    GEN NAD.  On BiPAP.  Alert and oriented x 4. HEENT supple neck, reactive pupils, EOMI CVS normal S1, normal S2, tachycardic, sinus rhythm Lungs diffuse bilateral expiratory wheezing Abdomen soft nontender nondistended positive bowel sounds Extremities warm well-perfused no edema   Labs and imaging were reviewed   Assessment and plan Case of  28 year old male patient with a past medical history of asthma on albuterol  as needed who is presenting to [facility] with shortness of breath wheezing and chest tightness.  He was found to  be in an asthma exacerbation secondary to community-acquired pneumonia.   # Acute asthma exacerbation secondary to # Community-acquired pneumonia with CT chest showing right upper lobe consolidation. # Hypertension   Plan []  Albuterol  neb every 4hours scheduled. And as needed.  []  c/w IV methylprednisolone  40 mg daily. []  Agree with ceftriaxone  and azithromycin . []  Chest PT with MetaNeb every 8 hour.  []  Will need to establish outpatient pulmonary care (I will arrange that).  Will need to be discharged on high-dose ICS/LABA and examples include Advair discus 500-50 1 puff twice a day.   Pulmonary team will follow.  I spent 50 minutes caring for this patient today, including preparing to see the patient, obtaining a medical history , reviewing a separately obtained history, performing a medically appropriate examination and/or evaluation, counseling and educating the patient/family/caregiver, documenting clinical information in the electronic health record, and independently interpreting results (not separately reported/billed) and communicating results to the patient/family/caregiver  Darrin Barn, MD  Pulmonary Critical Care 02/24/2024 2:23 PM

## 2024-02-25 LAB — CBC
HCT: 48.3 % (ref 39.0–52.0)
HCT: 49 % (ref 39.0–52.0)
Hemoglobin: 16.2 g/dL (ref 13.0–17.0)
Hemoglobin: 16.6 g/dL (ref 13.0–17.0)
MCH: 30 pg (ref 26.0–34.0)
MCH: 30.2 pg (ref 26.0–34.0)
MCHC: 33.5 g/dL (ref 30.0–36.0)
MCHC: 33.9 g/dL (ref 30.0–36.0)
MCV: 89.4 fL (ref 80.0–100.0)
MCV: 89.1 fL (ref 80.0–100.0)
Platelets: 273 K/uL (ref 150–400)
Platelets: 304 K/uL (ref 150–400)
RBC: 5.4 MIL/uL (ref 4.22–5.81)
RBC: 5.5 MIL/uL (ref 4.22–5.81)
RDW: 12.6 % (ref 11.5–15.5)
RDW: 12.5 % (ref 11.5–15.5)
WBC: 19 K/uL — ABNORMAL HIGH (ref 4.0–10.5)
WBC: 16.4 K/uL — ABNORMAL HIGH (ref 4.0–10.5)
nRBC: 0 % (ref 0.0–0.2)
nRBC: 0 % (ref 0.0–0.2)

## 2024-02-25 LAB — BASIC METABOLIC PANEL WITH GFR
Anion gap: 10 (ref 5–15)
Anion gap: 15 (ref 5–15)
BUN: 27 mg/dL — ABNORMAL HIGH (ref 6–20)
BUN: 24 mg/dL — ABNORMAL HIGH (ref 6–20)
CO2: 26 mmol/L (ref 22–32)
CO2: 24 mmol/L (ref 22–32)
Calcium: 8.5 mg/dL — ABNORMAL LOW (ref 8.9–10.3)
Calcium: 8.6 mg/dL — ABNORMAL LOW (ref 8.9–10.3)
Chloride: 103 mmol/L (ref 98–111)
Chloride: 98 mmol/L (ref 98–111)
Creatinine, Ser: 1.03 mg/dL (ref 0.61–1.24)
Creatinine, Ser: 1.05 mg/dL (ref 0.61–1.24)
GFR, Estimated: >60 mL/min (ref >60)
GFR, Estimated: >60 mL/min (ref >60)
Glucose, Bld: 111 mg/dL — ABNORMAL HIGH (ref 70–99)
Glucose, Bld: 146 mg/dL — ABNORMAL HIGH (ref 70–99)
Potassium: 4.2 mmol/L (ref 3.5–5.1)
Potassium: 4.6 mmol/L (ref 3.5–5.1)
Sodium: 139 mmol/L (ref 135–145)
Sodium: 137 mmol/L (ref 135–145)

## 2024-02-25 LAB — PHOSPHORUS: Phosphorus: 4.7 mg/dL — ABNORMAL HIGH (ref 2.5–4.6)

## 2024-02-25 LAB — EXPECTORATED SPUTUM ASSESSMENT W GRAM STAIN, RFLX TO RESP C

## 2024-02-25 LAB — MAGNESIUM: Magnesium: 2.5 mg/dL — ABNORMAL HIGH (ref 1.7–2.4)

## 2024-02-25 MED ORDER — PANTOPRAZOLE SODIUM 40 MG PO TBEC
40.0000 mg | DELAYED_RELEASE_TABLET | Freq: Every day | ORAL | Status: DC
  Administered 2024-02-26: 40 mg via ORAL
  Filled 2024-02-25: qty 1

## 2024-02-25 MED ORDER — PREDNISONE 20 MG PO TABS: 20.0000 mg | ORAL_TABLET | Freq: Every day | ORAL | Status: DC

## 2024-02-25 MED ORDER — HYDROCHLOROTHIAZIDE 25 MG PO TABS
25.0000 mg | ORAL_TABLET | Freq: Every day | ORAL | Status: DC
  Administered 2024-02-25 – 2024-02-26 (×2): 25 mg via ORAL
  Filled 2024-02-25 (×2): qty 1

## 2024-02-25 MED ORDER — AZITHROMYCIN 250 MG PO TABS
500.0000 mg | ORAL_TABLET | Freq: Every day | ORAL | Status: DC
Start: 2024-02-26 — End: 2024-02-26
  Administered 2024-02-26: 500 mg via ORAL
  Filled 2024-02-25: qty 2

## 2024-02-25 MED ORDER — ALBUTEROL SULFATE (2.5 MG/3ML) 0.083% IN NEBU
2.5000 mg | INHALATION_SOLUTION | Freq: Three times a day (TID) | RESPIRATORY_TRACT | Status: DC
  Administered 2024-02-25 – 2024-02-26 (×3): 2.5 mg via RESPIRATORY_TRACT
  Filled 2024-02-25 (×2): qty 3

## 2024-02-25 MED ORDER — PREDNISONE 20 MG PO TABS: 40.0000 mg | ORAL_TABLET | Freq: Every day | ORAL | Status: DC

## 2024-02-25 MED ORDER — PREDNISONE 10 MG PO TABS: 10.0000 mg | ORAL_TABLET | Freq: Every day | ORAL | Status: DC

## 2024-02-25 MED ORDER — PREDNISONE 20 MG PO TABS
40.0000 mg | ORAL_TABLET | Freq: Every day | ORAL | Status: DC
  Administered 2024-02-26: 40 mg via ORAL
  Filled 2024-02-25: qty 2

## 2024-02-25 MED ORDER — PREDNISONE 20 MG PO TABS: 30.0000 mg | ORAL_TABLET | Freq: Every day | ORAL | Status: DC

## 2024-02-25 NOTE — Plan of Care (Signed)
  Problem: Clinical Measurements: Goal: Ability to maintain clinical measurements within normal limits will improve Outcome: Progressing Goal: Will remain free from infection Outcome: Not Progressing Goal: Respiratory complications will improve Outcome: Progressing Goal: Cardiovascular complication will be avoided Outcome: Progressing   Problem: Activity: Goal: Risk for activity intolerance will decrease Outcome: Progressing

## 2024-02-25 NOTE — Plan of Care (Signed)

## 2024-02-25 NOTE — Progress Notes (Signed)
 Pt to be transferred to 2A-253. Report given to [employer]. Belongings sent with pt. Pt escorted by Sula PEAK.

## 2024-02-25 NOTE — Progress Notes (Signed)
 Case of 28 year old male patient with a past medical history of asthma on albuterol  as needed who is presenting to [facility] with shortness of breath wheezing and chest tightness.  He was found to  be in an asthma exacerbation secondary to community-acquired pneumonia.   He reports that his symptoms started a week ago his cough sputum production shortness of breath progressive dyspnea.  He presented to [facility] initially on nasal cannula on the floor.  CT chest with diffuse airway thickening as well as right upper lobe consolidation.  He was started on ceftriaxone  and azithromycin . Unfortunately course complicated by worsening respiratory status requiring bipap support.    On my encounter patient was in moderate respiratory distress on bipap with a respiratory rate of 30 bpm. Severe diffuse expiratory wheezing. We started an albuterol  neb for 1hr. 2mg  Mag IV ordered.    On re-assessment in 1 hour appears to have improved with improved air movement. Appears more comfortbalt on bipap.    Regarding his asthma he was diagnosed as a child and appears to have a strong family history of asthma.  He has been on albuterol  for the last few years and never on maintenance therapy.  He never required hospitalization for asthma exacerbation.  This is his first one.  Unfortunately he was a smoker but currently vapes daily.   Subjective:  Patient feeling better to day. Able to speak in full sentences. Remains with extensive wheezing but much improved since yesterda.  Started also on CPT and has been able to cough up phlegm easier.  Currently on nasal cannula at 4 L [place].   Objective    GEN NAD.  Comfortable on   Alert and oriented x 4. HEENT supple neck, reactive pupils, EOMI CVS normal S1, normal S2, tachycardic, sinus rhythm Lungs diffuse bilateral expiratory wheezing Abdomen soft nontender nondistended positive bowel sounds Extremities warm well-perfused no edema   Labs and imaging were reviewed   Assessment and  plan Case of 28 year old male patient with a past medical history of asthma on albuterol  as needed who is presenting to [facility] with shortness of breath wheezing and chest tightness.  He was found to  be in an asthma exacerbation secondary to community-acquired pneumonia.   # Acute asthma exacerbation secondary to # Community-acquired pneumonia with CT chest showing right upper lobe consolidation. # Hypertension   Plan []  Albuterol  neb every 4hours scheduled. And as needed.  []  c/w IV methylprednisolone  40 mg daily. Can transition to PO prednisone  40mg  and taper by 10mg  every 3 days.  []  Agree with ceftriaxone  and azithromycin . []  Chest PT with MetaNeb every 8 hour.  []  Will need to establish outpatient pulmonary care (I will arrange that).  Will need to be discharged on high-dose ICS/LABA and examples include Advair discus 500-50 1 puff twice a day or Breo 200 1 puff daily.  []  PT/OT  []  Might need to be discharged on Short term oxygen.    Pulmonary team will continue to follow.  I spent 50 minutes caring for this patient today, including preparing to see the patient, obtaining a medical history , reviewing a separately obtained history, performing a medically appropriate examination and/or evaluation, counseling and educating the patient/family/caregiver, documenting clinical information in the electronic health record, and independently interpreting results (not separately reported/billed) and communicating results to the patient/family/caregiver  Darrin Barn, MD [facility] 02/25/2024 3:20 PM

## 2024-02-25 NOTE — Progress Notes (Signed)
  Progress Note   Date: 02/25/2024  Patient Name: Justin [name]        MRN#: [id number]   Clarification of diagnosis: Sepsis present on admission from community-acquired pneumonia   Status: sepsis ruled in, sepsis is resolving, sepsis ruled out

## 2024-02-25 NOTE — Progress Notes (Signed)
 Progress Note   Patient: Justin [name] FMW:[id number] DOB: 12/14/1995 DOA: 02/23/2024     1 DOS: the patient was seen and examined on 02/25/2024   Brief hospital course:  Justin [name] is a pleasant 28 y.o. male with medical history significant for asthma, HTN, priapism and chronic vape use who came into the ED complaining of cough cold congestion and shortness of breath for 5 days.  Patient endorsed that he had urgent audible wheezing and chest tightness.  He was hypoxemic around 83% on room air and blood pressure was high. Patient denies any fever or chills, nausea vomiting, abdominal pain, diarrhea, palpitations.   ED Course: Upon arrival to the ED, patient is found to be hypertensive around 173/130, tachycardic around 107, tachypneic around 14, saturation 83% on room air.  Chest x-ray showed possible right upper lobe mucous plugging versus pneumonia.  CT chest showed possible pneumonia around that area as well.  Patient was given ceftriaxone , azithromycin , albuterol  nebulization, DuoNeb nebulization, methylprednisolone  125 mg and hospitalist service was consulted for evaluation for admission.     Assessment and Plan:   Acute hypoxic respiratory failure Secondary to acute asthma exacerbation most likely triggered by an acute viral illness Patient initially required BiPAP due to increased work of breathing, tachypnea, hypoxia with room air pulse oximetry of 83% Pulse oximetry and work of breathing improved on BiPAP and patient has been weaned down to nasal cannula at 4 to 5 L to maintain pulse oximetry greater than 92% Continues to require oxygen supplementation as his pulse oximetry dropped to 70% on room air with ambulation Will attempt to wean off oxygen as tolerated     Sepsis from community-acquired pneumonia Acute asthma exacerbation As evidenced by tachycardia, tachypnea and hypoxia with room air pulse oximetry of 83% with leukocytosis and imaging Imaging showing right upper  lobe consolidation Acute asthma exacerbation is secondary to acute viral illness Patient admitted  to having a sick contact Respiratory viral panel was positive for rhinovirus Continue systemic steroids Continue as needed and scheduled bronchodilator therapy Continue empiric antibiotic therapy with Rocephin  and Zithromax  to complete a 5-day course of therapy Worsening leukocytosis appears to be secondary to systemic steroids       Hypertension Poorly controlled Continue amlodipine  10 mg daily Will add HCTZ       09/24 -transferred to stepdown after rapid response was called due to worsening respiratory distress.  Patient was noted to be tachypneic, tachycardic and had worsening hypoxia requiring BiPAP   09/25 -off BiPAP and on nasal cannula.  Less short of breath.  Remains tachycardic on the monitor and continues to have diffuse wheezes   09/26 -feels better.  Remains on nasal cannula at 4 to 5 L.  Requesting to be discharged from        Subjective: Feeling better  Physical Exam: Vitals:   02/25/24 1000 02/25/24 1200 02/25/24 1314 02/25/24 1400  BP: 133/88 (!) 148/92  (!) 157/100  Pulse: 82 80  91  Resp: 18 (!) 24  (!) 24  Temp:  98.4 F (36.9 C)    TempSrc:  Oral    SpO2: 94% 90% 93% 93%  Weight:      Height:       Constitutional: Alert, awake, calm, comfortable HEENT: Neck supple Respiratory: Bilateral decreased air entry at the bases.  Scattered expiratory wheezes and rhonchi Cardiovascular: S1, S2 tachycardic, no murmurs / rubs / gallops. No extremity edema. 2+ pedal pulses. No carotid bruits.  Abdomen: Soft, no  tenderness, Bowel sounds positive.  Musculoskeletal: no clubbing / cyanosis. Good ROM, no contractures. Normal muscle tone.  Skin: no rashes, lesions, ulcers. Neurologic: CN 2-12 grossly intact. Sensation intact, No focal deficit identified Psychiatric: Alert and oriented x 3. Normal mood.       Data Reviewed: Sodium 139, potassium 4.2, chloride  103, bicarb 26, glucose 111, BUN 27, creatinine 1.03, calcium 8.5, magnesium  2.5, phosphorus 4.7, white count 19 Labs reviewed  Family Communication: Plan of care was discussed with patient at the bedside.  He verbalizes understanding and agrees with the plan  Disposition: Status is: Inpatient Remains inpatient appropriate because: Continues to have an oxygen requirement  Planned Discharge Destination: Home    Time spent: 55 minutes  Author: Aimee Somerset, MD 02/25/2024 2:36 PM  For on call review [email or web address].

## 2024-02-26 DIAGNOSIS — J9601 Acute respiratory failure with hypoxia: Secondary | ICD-10-CM | POA: Diagnosis present

## 2024-02-26 MED ORDER — AZITHROMYCIN 250 MG PO TABS: ORAL_TABLET | ORAL | 0 refills | Status: AC

## 2024-02-26 MED ORDER — PREDNISONE 10 MG PO TABS: ORAL_TABLET | ORAL | 0 refills | Status: AC

## 2024-02-26 MED ORDER — ALBUTEROL SULFATE (2.5 MG/3ML) 0.083% IN NEBU: 2.5000 mg | INHALATION_SOLUTION | RESPIRATORY_TRACT | 12 refills | Status: AC | PRN

## 2024-02-26 MED ORDER — CARVEDILOL 12.5 MG PO TABS: 12.5000 mg | ORAL_TABLET | Freq: Two times a day (BID) | ORAL | 0 refills | Status: AC

## 2024-02-26 MED ORDER — AMLODIPINE BESYLATE 10 MG PO TABS: 10.0000 mg | ORAL_TABLET | Freq: Every day | ORAL | 0 refills | Status: DC

## 2024-02-26 NOTE — Plan of Care (Signed)

## 2024-02-26 NOTE — Progress Notes (Signed)
 PT Cancellation Note  Patient Details Name: Justin [name] MRN: [id number] DOB: January 16, 1996   Cancelled Treatment:    Reason Eval/Treat Not Completed: PT screened, no needs identified, will sign off PT orders received, chart reviewed. Per OT, pt is ambulating 1000 ft without AD, does not require PT services at this time. PT to complete current orders.  Justin [name], PT, DPT 02/26/24, 9:35 AM   Justin [name] 02/26/2024, 9:34 AM

## 2024-02-26 NOTE — Evaluation (Signed)
 Occupational Therapy Evaluation Patient Details Name: Sayvon D Jarrells MRN: [id number] DOB: September 19, 1995 Today's Date: 02/26/2024   History of Present Illness   MELISSA PULIDO is a pleasant 28 y.o. male with medical history significant for asthma, HTN, priapism and chronic vape use who came into the ED complaining of cough cold congestion and shortness of breath for 5 days.  Patient endorsed that he had urgent audible wheezing and chest tightness.  He was hypoxemic around 83% on room air and blood pressure was high. Pt with PNA and positive for rhinovirus.     Clinical Impressions Upon entering the room, pt supine in bed with supportive significant other present in room. Pt reports feeling much better. He is Ind at baseline, driving, and working full time. He has two children at home (ages 2 and 8). Pt performs LB dressing Independent and ambulates without AD on RA 1000' with LOB or SOB. Pt returning to room with O2 saturation 93% and quickly returning to 95% on RA. Call bell and all needed items within reach. Pt with no acute OT needs at this time. OT to complete orders.        Precautions/Restrictions   Precautions Precautions: None     Mobility Bed Mobility Overal bed mobility: Independent                  Transfers Overall transfer level: Independent                        Balance Overall balance assessment: Independent                                         ADL either performed or assessed with clinical judgement   ADL Overall ADL's : Independent                                             Vision Baseline Vision/History: 0 No visual deficits Patient Visual Report: No change from baseline              Pertinent Vitals/Pain Pain Assessment Pain Assessment: No/denies pain     Extremity/Trunk Assessment Upper Extremity Assessment Upper Extremity Assessment: Generalized weakness   Lower Extremity  Assessment Lower Extremity Assessment: Generalized weakness       Communication     Cognition Arousal: Alert Behavior During Therapy: WFL for tasks assessed/performed Cognition: No apparent impairments                               Following commands: Intact       Cueing  General Comments   Cueing Techniques: Verbal cues              Home Living Family/patient expects to be discharged to:: Private residence Living Arrangements: Spouse/significant other Available Help at Discharge: Family;Available PRN/intermittently Type of Home: House Home Access: Level entry     Home Layout: One level     Bathroom Shower/Tub: Tub/shower unit;Walk-in shower         Home Equipment: None          Prior Functioning/Environment Prior Level of Function : Independent/Modified Independent;Driving;Working/employed  End of Session    Activity Tolerance: Patient tolerated treatment well Patient left:                     Time: 0915-0930 OT Time Calculation (min): 15 min Charges:  OT General Charges $OT Visit: 1 Visit OT Evaluation $OT Eval Low Complexity: 1 Low  Izetta Claude, MS, OTR/L , CBIS ascom [phone]  02/26/24, 9:37 AM

## 2024-02-26 NOTE — Discharge Summary (Signed)
 Physician Discharge Summary   Patient: Justin [name] MRN: [id number] DOB: 1996/01/28  Admit date:     02/23/2024  Discharge date: 02/26/24  Discharge Physician: Derica Leiber   PCP: [facility]   Recommendations at discharge:   Take medications as recommended Complete course of systemic steroids Return to ER for evaluation of worsening symptoms  Discharge Diagnoses: Principal Problem:   Acute hypoxic respiratory failure (HCC) Active Problems:   Sepsis due to pneumonia (HCC)   Acute exacerbation of asthma with allergic rhinitis   HTN (hypertension)   Tobacco use disorder  Resolved Problems:   * No resolved hospital problems. *  Hospital Course:  Justin [name] is a pleasant 28 y.o. male with medical history significant for asthma, HTN, priapism and chronic vape use who came into the ED complaining of cough cold congestion and shortness of breath for 5 days.  Patient endorsed that he had urgent audible wheezing and chest tightness.  He was hypoxemic around 83% on room air and blood pressure was high. Patient denies any fever or chills, nausea vomiting, abdominal pain, diarrhea, palpitations.   ED Course: Upon arrival to the ED, patient is found to be hypertensive around 173/130, tachycardic around 107, tachypneic around 14, saturation 83% on room air.  Chest x-ray showed possible right upper lobe mucous plugging versus pneumonia.  CT chest showed possible pneumonia around that area as well.  Patient was given ceftriaxone , azithromycin , albuterol  nebulization, DuoNeb nebulization, methylprednisolone  125 mg and hospitalist service was consulted for evaluation for admission.    Assessment and Plan:  Acute hypoxic respiratory failure Secondary to acute asthma exacerbation most likely triggered by an acute viral illness.  Respiratory viral panel came back positive for rhinovirus Patient initially required BiPAP due to increased work of breathing, tachypnea,  hypoxia with room air pulse oximetry of 83% Pulse oximetry and work of breathing improved on BiPAP and patient was weaned down to nasal cannula at 4 to 5 L to maintain pulse oximetry greater than 92% He has been weaned off oxygen and was assessed for home oxygen need. Post ventilator pulse ox was 93%.     Sepsis from community-acquired pneumonia Acute asthma exacerbation As evidenced by tachycardia, tachypnea and hypoxia with room air pulse oximetry of 83% with leukocytosis and imaging  showing right upper lobe consolidation Acute asthma exacerbation was secondary to acute viral illness Patient admitted  to having a sick contact Respiratory viral panel was positive for rhinovirus Continue systemic steroids Continue as needed and scheduled bronchodilator therapy Continue empiric antibiotic therapy with Rocephin  and Zithromax  to complete a 5-day course of therapy Worsening leukocytosis appears to be secondary to systemic steroids shows a downward trend       Hypertension Improved Continue carvedilol , hydralazine  and amlodipine  has been increased to 10 mg daily          Consultants: Pulmonary/ critical care Procedures performed: Non invasive mechanical ventilation/BiPAP Disposition: Home Diet recommendation:  Discharge Diet Orders (From admission, onward)     Start     Ordered   02/26/24 0000  Diet - low sodium heart healthy        02/26/24 1054           Cardiac diet DISCHARGE MEDICATION: Allergies as of 02/26/2024       Reactions   Other Hives   Sulfate        Medication List     STOP taking these medications    ibuprofen  600 MG tablet Commonly known  as: ADVIL        TAKE these medications    albuterol  108 (90 Base) MCG/ACT inhaler Commonly known as: VENTOLIN  HFA Inhale 2 puffs into the lungs every 4 (four) hours as needed for wheezing. What changed: Another medication with the same name was added. Make sure you understand how and when to take  each.   albuterol  (2.5 MG/3ML) 0.083% nebulizer solution Commonly known as: PROVENTIL  Take 3 mLs (2.5 mg total) by nebulization every 4 (four) hours as needed for wheezing or shortness of breath (wheezing and shortness of breath). What changed: You were already taking a medication with the same name, and this prescription was added. Make sure you understand how and when to take each.   amLODipine  10 MG tablet Commonly known as: NORVASC  Take 1 tablet (10 mg total) by mouth daily. Start taking on: February 27, 2024 What changed:  medication strength how much to take   azithromycin  250 MG tablet Commonly known as: Zithromax  Z-Pak Take 2 tablets (500 mg) on  Day 1,  followed by 1 tablet (250 mg) once daily on Days 2 through 5.   carvedilol  12.5 MG tablet Commonly known as: COREG  Take 1 tablet (12.5 mg total) by mouth 2 (two) times daily.   fluticasone  50 MCG/ACT nasal spray Commonly known as: Flonase  Place 1 spray into both nostrils daily.   hydrALAZINE  50 MG tablet Commonly known as: APRESOLINE  Take 50 mg by mouth 2 (two) times daily.   pantoprazole  40 MG tablet Commonly known as: Protonix  Take 1 tablet (40 mg total) by mouth daily.   predniSONE  10 MG tablet Commonly known as: DELTASONE  Take 4 tablets (40 mg total) by mouth daily with breakfast for 2 days, THEN 3 tablets (30 mg total) daily with breakfast for 3 days, THEN 2 tablets (20 mg total) daily with breakfast for 3 days, THEN 1 tablet (10 mg total) daily with breakfast for 3 days. Start taking on: February 27, 2024               Durable Medical Equipment  (From admission, onward)           Start     Ordered   02/26/24 0000  For home use only DME Nebulizer machine       Question Answer Comment  Patient needs a nebulizer to treat with the following condition Asthma attack   Length of Need Lifetime   Additional equipment included Administration kit   Additional equipment included Filter      02/26/24  1055            Follow-up Information     [facility] Follow up in 1 week(s).   Contact information: [address] [place] [address] [phone]                Discharge Exam: Filed Weights   02/23/24 0850 02/23/24 1800  Weight: 103.9 kg 96.3 kg  Constitutional: Alert, awake, calm, comfortable HEENT: Neck supple Respiratory: Bilateral decreased air entry at the bases.  Scattered expiratory wheezes and rhonchi Cardiovascular: S1, S2 tachycardic, no murmurs / rubs / gallops. No extremity edema. 2+ pedal pulses. No carotid bruits.  Abdomen: Soft, no tenderness, Bowel sounds positive.  Musculoskeletal: no clubbing / cyanosis. Good ROM, no contractures. Normal muscle tone.  Skin: no rashes, lesions, ulcers. Neurologic: CN 2-12 grossly intact. Sensation intact, No focal deficit identified Psychiatric: Alert and oriented x 3. Normal mood.        Condition at discharge:  stable  The results of significant diagnostics from this hospitalization (including imaging, microbiology, ancillary and laboratory) are listed below for reference.   Imaging Studies: CT Chest W Contrast Result Date: 02/23/2024 CLINICAL DATA:  Respiratory illness, cough, right upper lobe airspace opacity, hypoxia EXAM: CT CHEST WITH CONTRAST TECHNIQUE: Multidetector CT imaging of the chest was performed during intravenous contrast administration. RADIATION DOSE REDUCTION: This exam was performed according to the departmental dose-optimization program which includes automated exposure control, adjustment of the mA and/or kV according to patient size and/or use of iterative reconstruction technique. CONTRAST:  75mL OMNIPAQUE  IOHEXOL  300 MG/ML  SOLN COMPARISON:  Radiograph 02/23/2024 FINDINGS: Cardiovascular: Unremarkable Mediastinum/Nodes: Circumferential distal esophageal wall thickening, nonspecific although esophagitis would be a common cause. Borderline enlarged 1.0 cm right lower  paratracheal node on image 38 series 4. Mildly enlarged right hilar lymph node 1.2 cm in short axis on image 56 series 2. Additional small paratracheal and subcarinal lymph nodes observed. Lungs/Pleura: Complete truncation of the right upper lobe bronchus without a well-defined mass, presumably from complete plugging the right upper lobe proximal bronchial tree, with associated volume loss and consolidation in the right upper lobe. Due to the surrounding atelectasis and adenopathy, the possibility of a small endobronchial lesion in the right upper lobe bronchus is not completely excluded. However, there is bilateral airway thickening as well as scattered airway plugging in both lower lobes as well as the left upper lobe and right middle lobe and accordingly mucus plugging is favored over neoplasm. Faint patchy ground-glass opacities in the right middle lobe with associated airway thickening and airway plugging. Upper Abdomen: Unremarkable Musculoskeletal: Unremarkable IMPRESSION: 1. Airway thickening and scattered airway plugging in both lungs. 2. Complete truncation of the right upper lobe bronchus without a well-defined mass, presumably from complete plugging of the right upper lobe proximal bronchial tree, with associated volume loss and consolidation in the right upper lobe. A component of right upper lobe pneumonia is favored. 3. Faint patchy ground-glass opacities in the right middle lobe with associated airway thickening and airway plugging, probably from mild atypical pneumonia. 4. Mildly enlarged right hilar lymph node and borderline enlarged right lower paratracheal lymph node, likely reactive. 5. Circumferential distal esophageal wall thickening, nonspecific although esophagitis would be a common cause. Electronically Signed   By: Ryan Salvage M.D.   On: 02/23/2024 10:52   DG Chest Port 1 View Result Date: 02/23/2024 CLINICAL DATA:  Shortness of breath. Asthma with upper respiratory infection and  cough over the last 5 days. Wheezing. Chest tightness. Hypoxia. Hypertension. EXAM: PORTABLE CHEST 1 VIEW COMPARISON:  03/29/2016 FINDINGS: Complete or nearly complete atelectasis of the right upper lobe with resulting volume loss and shift of mediastinal structures to the right. Heart size within normal limits. The lungs appear otherwise clear. No blunting of the costophrenic angles. IMPRESSION: 1. Complete or nearly complete atelectasis of the right upper lobe with resulting volume loss and shift of mediastinal structures to the right. Possibilities may include mucous plugging or a centrally obstructing lesion/mass, although malignancy would be unlikely in this patient's age group. Chest CT with contrast is recommended for further characterization. Electronically Signed   By: Ryan Salvage M.D.   On: 02/23/2024 09:56    Microbiology: Results for orders placed or performed during the hospital encounter of 02/23/24  Resp panel by RT-PCR (RSV, Flu A&B, Covid) Anterior Nasal Swab     Status: None   Collection Time: 02/23/24  8:56 AM   Specimen: Anterior Nasal Swab  Result  Value Ref Range Status   SARS Coronavirus 2 by RT PCR NEGATIVE NEGATIVE Final    Comment: (NOTE) SARS-CoV-2 target nucleic acids are NOT DETECTED.  The SARS-CoV-2 RNA is generally detectable in upper respiratory specimens during the acute phase of infection. The lowest concentration of SARS-CoV-2 viral copies this assay can detect is 138 copies/mL. A negative result does not preclude SARS-Cov-2 infection and should not be used as the sole basis for treatment or other patient management decisions. A negative result may occur with  improper specimen collection/handling, submission of specimen other than nasopharyngeal swab, presence of viral mutation(s) within the areas targeted by this assay, and inadequate number of viral copies(<138 copies/mL). A negative result must be combined with clinical observations, patient  history, and epidemiological information. The expected result is Negative.  Fact Sheet for Patients:  BloggerCourse.com  Fact Sheet for Healthcare Providers:  SeriousBroker.it  This test is no t yet approved or cleared by the [place]  FDA and  has been authorized for detection and/or diagnosis of SARS-CoV-2 by FDA under an Emergency Use Authorization (EUA). This EUA will remain  in effect (meaning this test can be used) for the duration of the COVID-19 declaration under Section 564(b)(1) of the Act, 21 U.S.C.section 360bbb-3(b)(1), unless the authorization is terminated  or revoked sooner.       Influenza A by PCR NEGATIVE NEGATIVE Final   Influenza B by PCR NEGATIVE NEGATIVE Final    Comment: (NOTE) The Xpert Xpress SARS-CoV-2/FLU/RSV plus assay is intended as an aid in the diagnosis of influenza from Nasopharyngeal swab specimens and should not be used as a sole basis for treatment. Nasal washings and aspirates are unacceptable for Xpert Xpress SARS-CoV-2/FLU/RSV testing.  Fact Sheet for Patients: BloggerCourse.com  Fact Sheet for Healthcare Providers: SeriousBroker.it  This test is not yet approved or cleared by the [place]  FDA and has been authorized for detection and/or diagnosis of SARS-CoV-2 by FDA under an Emergency Use Authorization (EUA). This EUA will remain in effect (meaning this test can be used) for the duration of the COVID-19 declaration under Section 564(b)(1) of the Act, 21 U.S.C. section 360bbb-3(b)(1), unless the authorization is terminated or revoked.     Resp Syncytial Virus by PCR NEGATIVE NEGATIVE Final    Comment: (NOTE) Fact Sheet for Patients: BloggerCourse.com  Fact Sheet for Healthcare Providers: SeriousBroker.it  This test is not yet approved or cleared by the [place]  FDA  and has been authorized for detection and/or diagnosis of SARS-CoV-2 by FDA under an Emergency Use Authorization (EUA). This EUA will remain in effect (meaning this test can be used) for the duration of the COVID-19 declaration under Section 564(b)(1) of the Act, 21 U.S.C. section 360bbb-3(b)(1), unless the authorization is terminated or revoked.  Performed at [facility], [address] [place], [place] [address]   Blood culture (routine x 2)     Status: None (Preliminary result)   Collection Time: 02/23/24 11:48 AM   Specimen: BLOOD  Result Value Ref Range Status   Specimen Description BLOOD BLOOD RIGHT ARM  Final   Special Requests   Final    BOTTLES DRAWN AEROBIC AND ANAEROBIC Blood Culture adequate volume   Culture   Final    NO GROWTH 3 DAYS Performed at [facility], [address], [place], [place] [address]    Report Status PENDING  Incomplete  Blood culture (routine x 2)     Status: None (Preliminary result)   Collection Time: 02/23/24 11:48 AM  Specimen: BLOOD  Result Value Ref Range Status   Specimen Description BLOOD BLOOD LEFT FOREARM  Final   Special Requests   Final    BOTTLES DRAWN AEROBIC AND ANAEROBIC Blood Culture results may not be optimal due to an inadequate volume of blood received in culture bottles   Culture   Final    NO GROWTH 3 DAYS Performed at [facility], [address], [place], [place] [address]    Report Status PENDING  Incomplete  MRSA Next Gen by PCR, Nasal     Status: None   Collection Time: 02/23/24  6:48 PM   Specimen: Nasal Mucosa; Nasal Swab  Result Value Ref Range Status   MRSA by PCR Next Gen NOT DETECTED NOT DETECTED Final    Comment: (NOTE) The GeneXpert MRSA Assay (FDA approved for NASAL specimens only), is one component of a comprehensive MRSA colonization surveillance program. It is not intended to diagnose MRSA infection nor to guide or monitor treatment for MRSA infections. Test  performance is not FDA approved in patients less than 39 years old. Performed at [facility], [address] [place], [place] [address]   Respiratory (~20 pathogens) panel by PCR     Status: Abnormal   Collection Time: 02/23/24  6:59 PM   Specimen: Nasopharyngeal Swab; Respiratory  Result Value Ref Range Status   Adenovirus NOT DETECTED NOT DETECTED Final   Coronavirus 229E NOT DETECTED NOT DETECTED Final    Comment: (NOTE) The Coronavirus on the Respiratory Panel, DOES NOT test for the novel  Coronavirus (2019 nCoV)    Coronavirus HKU1 NOT DETECTED NOT DETECTED Final   Coronavirus NL63 NOT DETECTED NOT DETECTED Final   Coronavirus OC43 NOT DETECTED NOT DETECTED Final   Metapneumovirus NOT DETECTED NOT DETECTED Final   Rhinovirus / Enterovirus DETECTED (A) NOT DETECTED Final   Influenza A NOT DETECTED NOT DETECTED Final   Influenza B NOT DETECTED NOT DETECTED Final   Parainfluenza Virus 1 NOT DETECTED NOT DETECTED Final   Parainfluenza Virus 2 NOT DETECTED NOT DETECTED Final   Parainfluenza Virus 3 NOT DETECTED NOT DETECTED Final   Parainfluenza Virus 4 NOT DETECTED NOT DETECTED Final   Respiratory Syncytial Virus NOT DETECTED NOT DETECTED Final   Bordetella pertussis NOT DETECTED NOT DETECTED Final   Bordetella Parapertussis NOT DETECTED NOT DETECTED Final   Chlamydophila pneumoniae NOT DETECTED NOT DETECTED Final   Mycoplasma pneumoniae NOT DETECTED NOT DETECTED Final    Comment: Performed at [facility], [address] [place], [place] [address]  Expectorated Sputum Assessment w Gram Stain, Rflx to Resp Cult     Status: None   Collection Time: 02/25/24  9:09 AM   Specimen: Sputum  Result Value Ref Range Status   Specimen Description SPUTUM  Final   Special Requests NONE  Final   Sputum evaluation   Final    THIS SPECIMEN IS ACCEPTABLE FOR SPUTUM CULTURE Performed at [facility], [address] [place], [place] [address]    Report Status  02/25/2024 FINAL  Final  Culture, Respiratory w Gram Stain     Status: None (Preliminary result)   Collection Time: 02/25/24  9:09 AM   Specimen: SPU  Result Value Ref Range Status   Specimen Description   Final    SPUTUM Performed at [facility], [address] [place], [place] [address]    Special Requests   Final    NONE Reflexed from [phone] Performed at [facility],  [address], [place], [place] [address]    Gram Stain   Final    RARE WBC PRESENT, PREDOMINANTLY PMN RARE GRAM POSITIVE COCCI    Culture   Final    CULTURE REINCUBATED FOR BETTER GROWTH Performed at [facility], [address] [place], [place] [address]    Report Status PENDING  Incomplete    Labs: CBC: Recent Labs  Lab 02/23/24 0859 02/24/24 0529 02/25/24 0402 02/25/24 1558  WBC 10.1 12.3* 19.0* 16.4*  NEUTROABS  --  11.0*  --   --   HGB 17.7* 16.5 16.2 16.6  HCT 52.2* 49.0 48.3 49.0  MCV 88.6 87.8 89.4 89.1  PLT 268 288 273 304   Basic Metabolic Panel: Recent Labs  Lab 02/23/24 0859 02/24/24 0529 02/25/24 0402 02/25/24 1558  NA 139 137 139 137  K 3.6 4.4 4.2 4.6  CL 100 100 103 98  CO2 28 25 26 24   GLUCOSE 118* 129* 111* 146*  BUN 15 18 27* 24*  CREATININE 0.93 0.97 1.03 1.05  CALCIUM 9.1 8.6* 8.5* 8.6*  MG 2.2 2.5* 2.5*  --   PHOS  --  4.9* 4.7*  --    Liver Function Tests: Recent Labs  Lab 02/23/24 0859  AST 23  ALT 33  ALKPHOS 57  BILITOT 1.0  PROT 8.2*  ALBUMIN 4.4   CBG: Recent Labs  Lab 02/23/24 1732  GLUCAP 129*    Discharge time spent: greater than 30 minutes.  Signed: Aimee Somerset, MD [facility] 02/26/2024

## 2024-02-28 LAB — CULTURE, RESPIRATORY W GRAM STAIN: Culture: NORMAL

## 2024-02-28 LAB — CULTURE, BLOOD (ROUTINE X 2)
Culture: NO GROWTH
Culture: NO GROWTH
Special Requests: ADEQUATE

## 2024-03-01 ENCOUNTER — Other Ambulatory Visit: Payer: Self-pay | Admitting: Family Medicine

## 2024-03-01 DIAGNOSIS — R809 Proteinuria, unspecified: Secondary | ICD-10-CM

## 2024-03-01 DIAGNOSIS — I10 Essential (primary) hypertension: Secondary | ICD-10-CM

## 2024-03-03 ENCOUNTER — Ambulatory Visit
Admission: RE | Admit: 2024-03-03 | Discharge: 2024-03-03 | Disposition: A | Discharge status: Home / Self Care | Payer: Self-pay | Source: Ambulatory Visit | Attending: Family Medicine | Admitting: Family Medicine

## 2024-03-03 DIAGNOSIS — I10 Essential (primary) hypertension: Secondary | ICD-10-CM | POA: Insufficient documentation

## 2024-03-03 DIAGNOSIS — R809 Proteinuria, unspecified: Secondary | ICD-10-CM | POA: Insufficient documentation

## 2024-03-07 ENCOUNTER — Other Ambulatory Visit: Payer: Self-pay

## 2024-03-07 ENCOUNTER — Ambulatory Visit: Payer: Self-pay

## 2024-03-07 ENCOUNTER — Telehealth: Payer: Self-pay | Admitting: Pulmonary Disease

## 2024-03-07 ENCOUNTER — Encounter: Payer: Self-pay | Admitting: Pulmonary Disease

## 2024-03-07 DIAGNOSIS — J4550 Severe persistent asthma, uncomplicated: Secondary | ICD-10-CM

## 2024-03-07 MED ORDER — TRELEGY ELLIPTA 200-62.5-25 MCG/ACT IN AEPB
1.0000 | INHALATION_SPRAY | Freq: Every day | RESPIRATORY_TRACT | 6 refills | Status: AC
  Filled 2024-03-07: qty 3, fill #0

## 2024-03-07 NOTE — Telephone Encounter (Signed)
 See patient advise message from today.  Closing this encounter.

## 2024-03-07 NOTE — Telephone Encounter (Signed)
   FYI Only or Action Required?: Action required by provider: clinical question for provider.  Patient is followed in Pulmonology for new patient, last seen on new patient.  Called Nurse Triage reporting request for lab results.  Symptoms began n/a.  Interventions attempted: Other: n/a.  Symptoms are: n/a.  Triage Disposition: Call PCP When Office is Open  Patient/caregiver understands and will follow disposition?: Yes Copied from CRM #[id number]. Topic: Clinical - Lab/Test Results >> Mar 07, 2024 10:20 AM Justin [name] wrote: Reason for CRM: the pt's girlfriend Justin [name] is calling w/ the pt on the other line. pt would like you to call her and go over results to give him a better understanding.  The dr has not resulted yet.  Explained to them that no one is on his DPR, and at his appt on 10/16 he should fill one out Both verbalized understanding.   Pt would stillprefer call when dr has seen them. Reason for Disposition  Caller requesting routine or non-urgent lab result  Answer Assessment - Initial Assessment Questions 1. REASON FOR CALL or QUESTION: What is your reason for calling today? or How can I best     Patient requesting results and interpretation of recent labs, specifically, BUN and glucose stating that he has concern for diabets 2. CALLER: Document the source of call. (e.g., laboratory staff, caregiver or patient).     patient  Protocols used: PCP Call - No Triage-A-AH

## 2024-03-07 NOTE — Telephone Encounter (Signed)
 Prescribing Trelegy Ellipta 200 1 puff daily.

## 2024-03-16 ENCOUNTER — Ambulatory Visit (INDEPENDENT_AMBULATORY_CARE_PROVIDER_SITE_OTHER): Payer: Self-pay | Admitting: Pulmonary Disease

## 2024-03-16 ENCOUNTER — Encounter: Payer: Self-pay | Admitting: Pulmonary Disease

## 2024-03-16 VITALS — BP 130/80 | HR 97 | Temp 97.7°F | Ht 73.0 in | Wt 225.6 lb

## 2024-03-16 DIAGNOSIS — J4550 Severe persistent asthma, uncomplicated: Secondary | ICD-10-CM

## 2024-03-16 DIAGNOSIS — F17200 Nicotine dependence, unspecified, uncomplicated: Secondary | ICD-10-CM

## 2024-03-16 LAB — NITRIC OXIDE: Nitric Oxide: 33

## 2024-03-16 MED ORDER — BREZTRI AEROSPHERE 160-9-4.8 MCG/ACT IN AERO: 2.0000 | INHALATION_SPRAY | Freq: Two times a day (BID) | RESPIRATORY_TRACT | 6 refills | Status: AC

## 2024-03-17 NOTE — Progress Notes (Signed)
 Synopsis: Referred in by Center,  Comm*   Subjective:   PATIENT ID: Justin [name]: male DOB: [birth year], MRN: [id number]  Chief Complaint  Patient presents with   Asthma    Has been diagnosed with Asthma. DOE. No wheezing or cough.  Not using Trelegy. Using Albuterol  3-4 times a day.     HPI Justin [name] is a 28 year old male patient with a past medical history of asthma on albuterol  as needed who is presenting to the pulmonary clinic after a recent admission to [facility] for an asthma exacerbation secondary to  CAP.   Regarding his asthma he was diagnosed as a child and appears to have a strong family history of asthma. He has been on albuterol  for the last few years and never on maintenance therapy. He never required hospitalization for asthma exacerbation. This is his first one. Unfortunately he was a smoker but currently vapes daily.   Today he feels significantly better but unfortunatley could not get his trelegy and therefore is only on albuterol  which he is using on a daily basis.   He unfortubately is a daily vapor and used to smoke cigarettes in the past.    History reviewed. No pertinent family history.   Social History   Socioeconomic History   Marital status: Single    Spouse name: Not on file   Number of children: Not on file   Years of education: Not on file   Highest education level: Not on file  Occupational History   Not on file  Tobacco Use   Smoking status: Former    Current packs/day: 0.00    Types: Cigarettes    Quit date: 2024    Years since quitting: 1.7   Smokeless tobacco: Never   Tobacco comments:    Started smoking when he was young    Smoked 1 PPD at his heaviest  Advertising account planner   Vaping status: Every Day   Devices: with nicotine  Substance and Sexual Activity   Alcohol use: Yes   Drug use: Yes    Types: Marijuana   Sexual activity: Not on file  Other Topics Concern   Not on file  Social History Narrative   Not on file    Social Drivers of Health   Financial Resource Strain: Not on file  Food Insecurity: Patient Declined (02/23/2024)   Hunger Vital Sign    Worried About Running Out of Food in the Last Year: Patient declined    Ran Out of Food in the Last Year: Patient declined  Transportation Needs: Patient Declined (02/23/2024)   PRAPARE - Administrator, Civil Service (Medical): Patient declined    Lack of Transportation (Non-Medical): Patient declined  Physical Activity: Not on file  Stress: Not on file  Social Connections: Not on file  Intimate Partner Violence: Patient Declined (02/23/2024)   Humiliation, Afraid, Rape, and Kick questionnaire    Fear of Current or Ex-Partner: Patient declined    Emotionally Abused: Patient declined    Physically Abused: Patient declined    Sexually Abused: Patient declined        Objective:   Vitals:   03/16/24 1406  BP: 130/80  Pulse: 97  Temp: 97.7 F (36.5 C)  SpO2: 95%  Weight: 225 lb 9.6 oz (102.3 kg)  Height: 6' 1 (1.854 m)   95% on RA BMI Readings from Last 3 Encounters:  03/16/24 29.76 kg/m  02/23/24 28.01 kg/m  11/02/21 32.98 kg/m   Wt Readings from Last  3 Encounters:  03/16/24 225 lb 9.6 oz (102.3 kg)  02/23/24 212 lb 4.9 oz (96.3 kg)  11/02/21 250 lb (113.4 kg)    Physical Exam GEN: NAD HEENT: Supple Neck, Reactive Pupils, EOMI  CVS: Normal S1, Normal S2, RRR, No murmurs or ES appreciated  Lungs:Poor expiratory air movement but no wheezing appreciated.   Abdomen: Soft, non tender, non distended, + BS  Extremities: Warm and well perfused, No edema   Labs and imaging were reviewed.   Ancillary Information   CBC    Component Value Date/Time   WBC 16.4 (H) 02/25/2024 1558   RBC 5.50 02/25/2024 1558   HGB 16.6 02/25/2024 1558   HGB 17.1 04/02/2014 0648   HCT 49.0 02/25/2024 1558   HCT 50.6 04/02/2014 0648   PLT 304 02/25/2024 1558   PLT 274 04/02/2014 0648   MCV 89.1 02/25/2024 1558   MCV 91 04/02/2014  0648   MCH 30.2 02/25/2024 1558   MCHC 33.9 02/25/2024 1558   RDW 12.5 02/25/2024 1558   RDW 13.0 04/02/2014 0648   LYMPHSABS 0.9 02/24/2024 0529   MONOABS 0.4 02/24/2024 0529   EOSABS 0.0 02/24/2024 0529   BASOSABS 0.0 02/24/2024 0529         No data to display         Assessment & Plan:  Justin [name] is a 28 year old male patient with a past medical history of asthma on albuterol  as needed who is presenting to the pulmonary clinic after a recent admission to [facility] for an asthma exacerbation secondary to  CAP.   #Severe persistent asthmsa  #Recent asthma exacerbation seconadry to CAP with large right upper lobe consolidation.  FENO 33 indeterminate for intrapulmonary eosinophilic inflammation.   [] PFTs  []   CT chest wo contrast in 4 weeks to assess resolution of right upper lobe consolidation.  []  Start Breztri 2puffs BID.  []  c/w Albuterol  as needed.   #Vaping/Smoking counseling   Smoking/Tobacco Cessation Counseling Justin [name] is a current user of tobacco or nicotine products. He is ready to quit at this time. Counseling provided today addressed the risks of continued use and the benefits of cessation. Discussed tobacco/nicotine use history, readiness to quit, and evidence-based treatment options including behavioral strategies, support resources, and pharmacologic therapies. Provided encouragement and educational materials on steps and resources to quit smoking. Patient questions were addressed, and follow-up recommended for continued support. Total time spent on counseling: 4 minutes.      Return in about 3 months (around 06/16/2024).  I personally spent a total of 60 minutes in the care of the patient today including preparing to see the patient, getting/reviewing separately obtained history, performing a medically appropriate exam/evaluation, counseling and educating, placing orders, documenting clinical information in the EHR, independently interpreting results, and  communicating results.   Darrin Barn, MD [facility] 03/17/2024 2:11 PM

## 2024-04-19 ENCOUNTER — Other Ambulatory Visit: Payer: Self-pay

## 2024-04-19 ENCOUNTER — Emergency Department
Admission: EM | Admit: 2024-04-19 | Discharge: 2024-04-19 | Disposition: A | Discharge status: Home / Self Care | Payer: Self-pay | Attending: Emergency Medicine | Admitting: Emergency Medicine

## 2024-04-19 DIAGNOSIS — Z720 Tobacco use: Secondary | ICD-10-CM | POA: Insufficient documentation

## 2024-04-19 DIAGNOSIS — K644 Residual hemorrhoidal skin tags: Secondary | ICD-10-CM | POA: Insufficient documentation

## 2024-04-19 DIAGNOSIS — K649 Unspecified hemorrhoids: Secondary | ICD-10-CM

## 2024-04-19 DIAGNOSIS — I10 Essential (primary) hypertension: Secondary | ICD-10-CM | POA: Insufficient documentation

## 2024-04-19 MED ORDER — AMLODIPINE BESYLATE 10 MG PO TABS: 10.0000 mg | ORAL_TABLET | Freq: Every day | ORAL | 0 refills | Status: AC

## 2024-04-19 MED ORDER — HYDROCORTISONE 1 % EX OINT: 1.0000 | TOPICAL_OINTMENT | Freq: Two times a day (BID) | CUTANEOUS | 0 refills | Status: AC

## 2024-04-19 NOTE — Discharge Instructions (Addendum)
 Please apply the hydrocortisone  cream to the rectum twice a day. Do not use for longer than 7 days.  You can continue to use the Preparation H cream.  Return to the emergency department with any worsening symptoms.  I have sent a refill of your blood pressure medication. Please take as prescribed and follow up with your primary care provider.   I believe your back pain is due to a pulled muscle.  You can take 650 mg of Tylenol  and 600 mg of ibuprofen  every 6 hours as needed for pain. You can use ice, heat, muscle creams and other topical pain relievers as well.

## 2024-04-19 NOTE — ED Notes (Signed)
 Pt discharged to home, instructions and medications reviewed.  Pt verbalized understanding, has no questions at this time.

## 2024-04-19 NOTE — ED Triage Notes (Addendum)
 Pt comes with 5 days of hemorrhoids. Pt states worse when standing pt has tried otc meds with no relief.   Pt also states upper back pain.

## 2024-04-19 NOTE — ED Provider Notes (Signed)
 [facility] Provider Note    Event Date/Time   First MD Initiated Contact with Patient 04/19/24 1024     (approximate)   History   Hemorrhoids   HPI  Justin [name] is a 28 y.o. male with PMH of hypertension, tobacco use disorder and asthma presents for evaluation of hemorrhoids.  Patient states that he has pain when standing up. He has not noticed any bleeding with bowel movements, but does endorse sitting on the toilet for long periods of time. No pain with bowel movements.       Physical Exam   Triage Vital Signs: ED Triage Vitals [04/19/24 1007]  Encounter Vitals Group     BP (!) 182/107     Girls Systolic BP Percentile      Girls Diastolic BP Percentile      Boys Systolic BP Percentile      Boys Diastolic BP Percentile      Pulse Rate 77     Resp 18     Temp 98 F (36.7 C)     Temp src      SpO2 100 %     Weight 228 lb (103.4 kg)     Height 6' 1 (1.854 m)     Head Circumference      Peak Flow      Pain Score 4     Pain Loc      Pain Education      Exclude from Growth Chart     Most recent vital signs: Vitals:   04/19/24 1007  BP: (!) 182/107  Pulse: 77  Resp: 18  Temp: 98 F (36.7 C)  SpO2: 100%   General: Awake, no distress.  CV:  Good peripheral perfusion. RRR. Resp:  Normal effort.CTAB. Abd:  No distention. Rectal:  External hemorrhoid, not thrombosed but is tender to palpation, no pain with DRE Other:  TTP over left trapezius, no pain over the spine   ED Results / Procedures / Treatments   Labs (all labs ordered are listed, but only abnormal results are displayed) Labs Reviewed - No data to display   PROCEDURES:  Critical Care performed: No  Procedures   MEDICATIONS ORDERED IN ED: Medications - No data to display   IMPRESSION / MDM / ASSESSMENT AND PLAN / ED COURSE  I reviewed the triage vital signs and the nursing notes.                             28 year old male presents for evaluation of  hemorrhoids. BP is significantly elevated, VSS otherwise. Patient NAD on exam.   Differential diagnosis includes, but is not limited to, external hemorrhoid, internal hemorrhoid, anal fissure, musculoskeletal pain.   Patient's presentation is most consistent with acute, uncomplicated illness.  Patient has an external hemorrhoid on exam. It is not thrombosed so I did not do I&D today. Will prescribe some steroid cream for patient to try. Advised him to continue using preparation H cream. Discussed sitz baths, increased fiber, adequate hydration and decreasing amount of time sitting on the toilet.   Patient's BP was elevated today but he denies symptoms related to this including CP, SOB, headache, visual changes. He has not had his BP medicine for a month. He was sure that he was supposed to be taking amlodipine  but didn't know what other medications he was supposed to be on. I could not find a recent PCP visit  to review so I refilled only the amlodipine . Discussed the importance of following up with his PCP for his BP.   Patient's back pain was over the left trapezius and was reproducible. Do not feel this is consistent with aortic dissection or aneurysm despite his blood pressure being elevated.  Advised Tylenol  and ibuprofen  as needed for pain.  Discussed return precautions, patient voiced understanding, questions were answered and he stable at discharge.   FINAL CLINICAL IMPRESSION(S) / ED DIAGNOSES   Final diagnoses:  Hemorrhoids, unspecified hemorrhoid type     Rx / DC Orders   ED Discharge Orders          Ordered    amLODipine  (NORVASC ) 10 MG tablet  Daily        04/19/24 1126    hydrocortisone 1 % ointment  2 times daily        04/19/24 1128             Note:  This document was prepared using Dragon voice recognition software and may include unintentional dictation errors.   Cleaster Tinnie LABOR, PA-C 04/19/24 1401    Dicky Anes, MD 04/19/24 1626

## 2024-05-04 ENCOUNTER — Ambulatory Visit: Admission: RE | Admit: 2024-05-04 | Discharge: 2024-05-04 | Payer: Self-pay | Attending: Pulmonary Disease

## 2024-05-04 DIAGNOSIS — J4550 Severe persistent asthma, uncomplicated: Secondary | ICD-10-CM | POA: Insufficient documentation
